# Patient Record
Sex: Female | Born: 1965 | Race: Black or African American | Hispanic: No | Marital: Single | Smoking: Never smoker
Health system: Southern US, Community
[De-identification: ages and names within clinical notes are randomized; demographics above are authoritative.]

## PROBLEM LIST (undated history)

## (undated) DIAGNOSIS — E7800 Pure hypercholesterolemia, unspecified: Secondary | ICD-10-CM

## (undated) DIAGNOSIS — I10 Essential (primary) hypertension: Secondary | ICD-10-CM

## (undated) DIAGNOSIS — G43909 Migraine, unspecified, not intractable, without status migrainosus: Secondary | ICD-10-CM

---

## 1999-11-07 HISTORY — PX: ABDOMINAL HYSTERECTOMY: SHX81

## 2001-06-21 ENCOUNTER — Emergency Department (HOSPITAL_COMMUNITY): Admission: EM | Admit: 2001-06-21 | Discharge: 2001-06-21 | Payer: Self-pay | Admitting: Emergency Medicine

## 2001-10-23 ENCOUNTER — Emergency Department (HOSPITAL_COMMUNITY): Admission: EM | Admit: 2001-10-23 | Discharge: 2001-10-23 | Payer: Self-pay | Admitting: Emergency Medicine

## 2001-12-10 ENCOUNTER — Emergency Department (HOSPITAL_COMMUNITY): Admission: EM | Admit: 2001-12-10 | Discharge: 2001-12-10 | Payer: Self-pay | Admitting: Emergency Medicine

## 2002-08-27 ENCOUNTER — Other Ambulatory Visit: Admission: RE | Admit: 2002-08-27 | Discharge: 2002-08-27 | Payer: Self-pay | Admitting: General Surgery

## 2004-03-10 ENCOUNTER — Ambulatory Visit (HOSPITAL_COMMUNITY): Admission: RE | Admit: 2004-03-10 | Discharge: 2004-03-10 | Payer: Self-pay | Admitting: *Deleted

## 2005-08-10 ENCOUNTER — Emergency Department (HOSPITAL_COMMUNITY): Admission: EM | Admit: 2005-08-10 | Discharge: 2005-08-10 | Payer: Self-pay | Admitting: Emergency Medicine

## 2006-02-05 ENCOUNTER — Ambulatory Visit (HOSPITAL_COMMUNITY): Admission: RE | Admit: 2006-02-05 | Discharge: 2006-02-05 | Payer: Self-pay | Admitting: Pulmonary Disease

## 2007-01-02 ENCOUNTER — Emergency Department (HOSPITAL_COMMUNITY): Admission: EM | Admit: 2007-01-02 | Discharge: 2007-01-02 | Payer: Self-pay | Admitting: Emergency Medicine

## 2007-01-29 ENCOUNTER — Emergency Department (HOSPITAL_COMMUNITY): Admission: EM | Admit: 2007-01-29 | Discharge: 2007-01-29 | Payer: Self-pay | Admitting: Emergency Medicine

## 2007-10-23 ENCOUNTER — Emergency Department (HOSPITAL_COMMUNITY): Admission: EM | Admit: 2007-10-23 | Discharge: 2007-10-23 | Payer: Self-pay | Admitting: Emergency Medicine

## 2008-04-06 ENCOUNTER — Emergency Department (HOSPITAL_COMMUNITY): Admission: EM | Admit: 2008-04-06 | Discharge: 2008-04-06 | Payer: Self-pay | Admitting: Emergency Medicine

## 2008-05-23 ENCOUNTER — Emergency Department (HOSPITAL_COMMUNITY): Admission: EM | Admit: 2008-05-23 | Discharge: 2008-05-23 | Payer: Self-pay | Admitting: Emergency Medicine

## 2009-01-09 ENCOUNTER — Emergency Department (HOSPITAL_COMMUNITY): Admission: EM | Admit: 2009-01-09 | Discharge: 2009-01-09 | Payer: Self-pay | Admitting: Emergency Medicine

## 2009-03-27 ENCOUNTER — Emergency Department (HOSPITAL_COMMUNITY): Admission: EM | Admit: 2009-03-27 | Discharge: 2009-03-27 | Payer: Self-pay | Admitting: Emergency Medicine

## 2009-12-27 ENCOUNTER — Emergency Department (HOSPITAL_COMMUNITY): Admission: EM | Admit: 2009-12-27 | Discharge: 2009-12-27 | Payer: Self-pay | Admitting: Emergency Medicine

## 2010-02-23 ENCOUNTER — Ambulatory Visit (HOSPITAL_COMMUNITY): Admission: RE | Admit: 2010-02-23 | Discharge: 2010-02-23 | Payer: Self-pay | Admitting: Internal Medicine

## 2010-04-09 ENCOUNTER — Emergency Department (HOSPITAL_COMMUNITY): Admission: EM | Admit: 2010-04-09 | Discharge: 2010-04-09 | Payer: Self-pay | Admitting: Emergency Medicine

## 2010-04-27 ENCOUNTER — Encounter (HOSPITAL_COMMUNITY): Admission: RE | Admit: 2010-04-27 | Discharge: 2010-05-27 | Payer: Self-pay | Admitting: Internal Medicine

## 2010-05-05 ENCOUNTER — Emergency Department (HOSPITAL_COMMUNITY): Admission: EM | Admit: 2010-05-05 | Discharge: 2010-05-05 | Payer: Self-pay | Admitting: Emergency Medicine

## 2010-05-30 ENCOUNTER — Encounter (HOSPITAL_COMMUNITY): Admission: RE | Admit: 2010-05-30 | Discharge: 2010-06-29 | Payer: Self-pay | Admitting: Internal Medicine

## 2010-05-31 ENCOUNTER — Ambulatory Visit (HOSPITAL_COMMUNITY): Admission: RE | Admit: 2010-05-31 | Discharge: 2010-05-31 | Payer: Self-pay | Admitting: Gastroenterology

## 2010-08-09 ENCOUNTER — Ambulatory Visit (HOSPITAL_COMMUNITY): Admission: RE | Admit: 2010-08-09 | Discharge: 2010-08-09 | Payer: Self-pay | Admitting: Gastroenterology

## 2010-11-27 ENCOUNTER — Encounter: Payer: Self-pay | Admitting: Internal Medicine

## 2010-11-28 ENCOUNTER — Encounter: Payer: Self-pay | Admitting: Internal Medicine

## 2011-01-25 LAB — URINALYSIS, ROUTINE W REFLEX MICROSCOPIC
Bilirubin Urine: NEGATIVE
Protein, ur: 30 mg/dL — AB
Urobilinogen, UA: 1 mg/dL (ref 0.0–1.0)

## 2011-02-14 LAB — RAPID STREP SCREEN (MED CTR MEBANE ONLY): Streptococcus, Group A Screen (Direct): NEGATIVE

## 2011-02-16 LAB — RAPID STREP SCREEN (MED CTR MEBANE ONLY): Streptococcus, Group A Screen (Direct): NEGATIVE

## 2011-02-16 LAB — STREP A DNA PROBE: Group A Strep Probe: POSITIVE

## 2011-08-03 LAB — DIFFERENTIAL
Basophils Absolute: 0
Basophils Relative: 1
Eosinophils Relative: 3
Lymphocytes Relative: 45

## 2011-08-03 LAB — BASIC METABOLIC PANEL
BUN: 11
Calcium: 9.5
GFR calc non Af Amer: >60
Glucose, Bld: 101 — ABNORMAL HIGH
Potassium: 3.6

## 2011-08-03 LAB — CBC
HCT: 37
Platelets: 381
RDW: 13

## 2011-08-04 LAB — RAPID STREP SCREEN (MED CTR MEBANE ONLY): Streptococcus, Group A Screen (Direct): POSITIVE — AB

## 2012-01-25 ENCOUNTER — Emergency Department (HOSPITAL_COMMUNITY)
Admission: EM | Admit: 2012-01-25 | Discharge: 2012-01-25 | Disposition: A | Discharge status: Home / Self Care | Payer: Self-pay | Attending: Emergency Medicine | Admitting: Emergency Medicine

## 2012-01-25 ENCOUNTER — Encounter (HOSPITAL_COMMUNITY): Payer: Self-pay | Admitting: *Deleted

## 2012-01-25 DIAGNOSIS — E7800 Pure hypercholesterolemia, unspecified: Secondary | ICD-10-CM | POA: Insufficient documentation

## 2012-01-25 DIAGNOSIS — R112 Nausea with vomiting, unspecified: Secondary | ICD-10-CM | POA: Insufficient documentation

## 2012-01-25 DIAGNOSIS — H53149 Visual discomfort, unspecified: Secondary | ICD-10-CM | POA: Insufficient documentation

## 2012-01-25 DIAGNOSIS — G43909 Migraine, unspecified, not intractable, without status migrainosus: Secondary | ICD-10-CM | POA: Insufficient documentation

## 2012-01-25 DIAGNOSIS — I10 Essential (primary) hypertension: Secondary | ICD-10-CM | POA: Insufficient documentation

## 2012-01-25 HISTORY — DX: Pure hypercholesterolemia, unspecified: E78.00

## 2012-01-25 HISTORY — DX: Migraine, unspecified, not intractable, without status migrainosus: G43.909

## 2012-01-25 HISTORY — DX: Essential (primary) hypertension: I10

## 2012-01-25 MED ORDER — METOCLOPRAMIDE HCL 5 MG/ML IJ SOLN
10.0000 mg | Freq: Once | INTRAMUSCULAR | Status: AC
  Administered 2012-01-25: 10 mg via INTRAMUSCULAR
  Filled 2012-01-25: qty 2

## 2012-01-25 MED ORDER — PROMETHAZINE HCL 25 MG PO TABS: 25.0000 mg | ORAL_TABLET | Freq: Four times a day (QID) | ORAL | Status: AC | PRN

## 2012-01-25 MED ORDER — KETOROLAC TROMETHAMINE 60 MG/2ML IM SOLN
60.0000 mg | Freq: Once | INTRAMUSCULAR | Status: AC
  Administered 2012-01-25: 60 mg via INTRAMUSCULAR
  Filled 2012-01-25: qty 2

## 2012-01-25 MED ORDER — DIPHENHYDRAMINE HCL 25 MG PO CAPS
25.0000 mg | ORAL_CAPSULE | Freq: Once | ORAL | Status: AC
  Administered 2012-01-25: 25 mg via ORAL
  Filled 2012-01-25: qty 1

## 2012-01-25 NOTE — Discharge Instructions (Signed)
Rest, follow-up with your primary care doctor or return to ED for any worsening symtpoms   Recurrent Migraine Headache  You have a recurrent migraine headache. The caregiver can usually provide good relief for this headache. If this headache is the same as your previous migraine headaches, it is safe to treat you without repeating a complete evaluation.  These headaches usually have at least two of the following problems:   They occur on one side of the head, pulsate, and are severe enough to prevent daily activities.   They are aggravated by daily physical activities.  You may have one or more of the following symptoms:   Nausea (feeling sick to your stomach).   Vomiting.   Pain with exposure to bright lights or loud noises.  Most headache sufferers have a family history of migraines. Your headaches may also be related to alcohol and smoking habits. Too much sleep, too little sleep, mood, and anxiety may also play a part. Changing some of these triggers may help you lower the number and level of pain of the headaches. Headaches may be related to menses (female menstruation). There are numerous medications that can prevent these headaches. Your caregiver can help you with a medication or regimen (procedure to follow). If this has been a chronic (long-term) condition, the use of long-term narcotics is not recommended. Using long-term narcotics can cause recurrent migraines. Narcotics are only a temporary measure only. They are used for the infrequent migraine that fails to respond to all other measures. SEEK MEDICAL CARE IF:   You do not get relief from the medications given to you.   You have a recurrence of pain.   This headache begins to differ from past migraine (for example if it is more severe).  SEEK IMMEDIATE MEDICAL CARE IF:  You have a fever.   You have a stiff neck.   You have vision loss or have changes in vision.   You have problems with feeling lightheaded, become  faint, or lose your balance.   You have muscular weakness.   You have loss of muscular control.   You develop severe symptoms different from your first symptoms.   You start losing your balance or have trouble walking.   You feel faint or pass out.  MAKE SURE YOU:   Understand these instructions.   Will watch your condition.   Will get help right away if you are not doing well or get worse.  Document Released: 07/18/2001 Document Revised: 10/12/2011 Document Reviewed: 06/11/2008 [facility] Patient Information 2012 [place], [place].

## 2012-01-25 NOTE — ED Provider Notes (Signed)
History     CSN: [id number]  Arrival date & time 01/25/12  4098   First MD Initiated Contact with Patient 01/25/12 [phone]      Chief Complaint  Patient presents with  . Migraine    (Consider location/radiation/quality/duration/timing/severity/associated sxs/prior treatment) HPI Comments: Patient complains of a headache that began this morning. She describes the headache as a throbbing sensation to the parietal area. Headache has been gradual in onset She states the pain is similar to her previous migraines. She also reports photophobia , nausea and vomiting this morning. She denies neck pain or stiffness, fever, chest pain, numbness or weakness.  Patient is a 46 y.o. female presenting with headaches. The history is provided by the patient. No language interpreter was used.  Headache  This is a new problem. The current episode started 1 to 2 hours ago. The problem occurs constantly. The problem has not changed since onset.The headache is associated with bright light. The pain is located in the parietal region. The quality of the pain is described as throbbing. The pain is moderate. The pain does not radiate. Associated symptoms include nausea and vomiting. Pertinent negatives include no fever, no chest pressure, no near-syncope, no orthopnea and no shortness of breath. She has tried nothing for the symptoms. The treatment provided no relief.    Past Medical History  Diagnosis Date  . Migraines   . High cholesterol   . Hypertension     History reviewed. No pertinent past surgical history.  History reviewed. No pertinent family history.  History  Substance Use Topics  . Smoking status: Never Smoker   . Smokeless tobacco: Not on file  . Alcohol Use: No    OB History    Grav Para Term Preterm Abortions TAB SAB Ect Mult Living                  Review of Systems  Constitutional: Negative for fever and chills.  HENT: Negative for sore throat, trouble swallowing, neck pain and  neck stiffness.   Respiratory: Negative for shortness of breath.   Cardiovascular: Negative for chest pain, orthopnea and near-syncope.  Gastrointestinal: Positive for nausea and vomiting. Negative for abdominal pain and blood in stool.  Musculoskeletal: Negative for myalgias, back pain and arthralgias.  Skin: Negative for rash.  Neurological: Positive for headaches. Negative for dizziness, facial asymmetry, speech difficulty, weakness and numbness.  Hematological: Does not bruise/bleed easily.  All other systems reviewed and are negative.    Allergies  Review of patient's allergies indicates no known allergies.  Home Medications  No current outpatient prescriptions on file.  BP 120/75  Pulse 92  Temp(Src) 98.4 F (36.9 C) (Oral)  Resp 16  Ht 5\' 1"  (1.549 m)  Wt 165 lb (74.844 kg)  BMI 31.18 kg/m2  SpO2 98%  Physical Exam  Nursing note and vitals reviewed. Constitutional: She is oriented to person, place, and time. She appears well-developed and well-nourished. No distress.  HENT:  Head: Normocephalic and atraumatic.  Mouth/Throat: Oropharynx is clear and moist.  Eyes: EOM are normal. Pupils are equal, round, and reactive to light.  Neck: Normal range of motion and phonation normal. Neck supple. No Brudzinski's sign and no Kernig's sign noted.  Cardiovascular: Normal rate, regular rhythm, normal heart sounds and intact distal pulses.   No murmur heard. Pulmonary/Chest: Effort normal and breath sounds normal. No respiratory distress.  Abdominal: Soft. She exhibits no distension. There is no tenderness. There is no rebound.  Musculoskeletal: Normal range of  motion. She exhibits no tenderness.  Lymphadenopathy:    She has no cervical adenopathy.  Neurological: She is alert and oriented to person, place, and time. No cranial nerve deficit or sensory deficit. She exhibits normal muscle tone. Coordination and gait normal. GCS eye subscore is 4. GCS verbal subscore is 5. GCS motor  subscore is 6.  Reflex Scores:      Tricep reflexes are 2+ on the right side and 2+ on the left side.      Bicep reflexes are 2+ on the right side and 2+ on the left side.      Brachioradialis reflexes are 2+ on the right side and 2+ on the left side.      Patellar reflexes are 2+ on the right side and 2+ on the left side.      Achilles reflexes are 2+ on the right side and 2+ on the left side. Skin: Skin is warm and dry.    ED Course  Procedures (including critical care time)      MDM   Patient with history of recurrent migraine headaches and emergency department with a similar headache that began this morning. Vital signs are stable, she is nontoxic appearing complaining of nausea and vomiting but none during ED stay. No meningeal signs on exam.  Patient was treated with IM Toradol and Reglan and by mouth Benadryl.  Patient is now feeling better states her headache and nausea have greatly improved and requesting to go home. I have reviewed patient's previous ED charts and nursing notes, patient has been here several times for her migraine headaches  Patient / Family / Caregiver understand and agree with initial ED impression and plan with expectations set for ED visit. Pt stable in ED with no significant deterioration in condition. Pt feels improved after observation and/or treatment in ED.         Lajuane Leatham L. H. [place], [place] 01/25/12 1714

## 2012-01-25 NOTE — ED Notes (Signed)
Pt states that she woke up with a migraine this am. Also c/o nausea, vomiting and photophobia.

## 2012-01-26 NOTE — ED Provider Notes (Signed)
Medical screening examination/treatment/procedure(s) were performed by non-physician practitioner and as supervising physician I was immediately available for consultation/collaboration.  Nicoletta Dress. Colon Branch, MD 01/26/12 [phone]

## 2012-05-05 ENCOUNTER — Emergency Department (HOSPITAL_COMMUNITY)
Admission: EM | Admit: 2012-05-05 | Discharge: 2012-05-05 | Disposition: A | Discharge status: Home / Self Care | Payer: Self-pay | Attending: Emergency Medicine | Admitting: Emergency Medicine

## 2012-05-05 ENCOUNTER — Encounter (HOSPITAL_COMMUNITY): Payer: Self-pay | Admitting: Emergency Medicine

## 2012-05-05 DIAGNOSIS — M65839 Other synovitis and tenosynovitis, unspecified forearm: Secondary | ICD-10-CM | POA: Insufficient documentation

## 2012-05-05 DIAGNOSIS — M778 Other enthesopathies, not elsewhere classified: Secondary | ICD-10-CM

## 2012-05-05 MED ORDER — OXYCODONE-ACETAMINOPHEN 5-325 MG PO TABS: 1.0000 | ORAL_TABLET | Freq: Four times a day (QID) | ORAL | Status: AC | PRN

## 2012-05-05 MED ORDER — KETOROLAC TROMETHAMINE 60 MG/2ML IM SOLN
60.0000 mg | Freq: Once | INTRAMUSCULAR | Status: AC
  Administered 2012-05-05: 60 mg via INTRAMUSCULAR
  Filled 2012-05-05: qty 2

## 2012-05-05 MED ORDER — MELOXICAM 7.5 MG PO TABS: ORAL_TABLET | ORAL | Status: DC

## 2012-05-05 NOTE — ED Provider Notes (Signed)
Medical screening examination/treatment/procedure(s) were performed by non-physician practitioner and as supervising physician I was immediately available for consultation/collaboration.  Devoria Albe, MD, Armando Gang   Ward Givens, MD 05/05/12 [phone]

## 2012-05-05 NOTE — ED Notes (Signed)
Patient with c/o right wrist pain x 1 week. Denies known injury. CMS intact. Swelling noted.

## 2012-05-05 NOTE — ED Provider Notes (Signed)
History     CSN: [id number]  Arrival date & time 05/05/12  1106   First MD Initiated Contact with Patient 05/05/12 1317      Chief Complaint  Patient presents with  . Wrist Pain    (Consider location/radiation/quality/duration/timing/severity/associated sxs/prior treatment) Patient is a 46 y.o. female presenting with wrist pain. The history is provided by the patient.  Wrist Pain This is a new problem. The current episode started in the past 7 days. The problem occurs constantly. The problem has been gradually worsening. Associated symptoms include arthralgias. The symptoms are aggravated by bending. She has tried NSAIDs for the symptoms. The treatment provided no relief.    Past Medical History  Diagnosis Date  . Migraines   . High cholesterol   . Hypertension     History reviewed. No pertinent past surgical history.  No family history on file.  History  Substance Use Topics  . Smoking status: Never Smoker   . Smokeless tobacco: Not on file  . Alcohol Use: No    OB History    Grav Para Term Preterm Abortions TAB SAB Ect Mult Living                  Review of Systems  Musculoskeletal: Positive for arthralgias.    Allergies  Review of patient's allergies indicates no known allergies.  Home Medications   Current Outpatient Rx  Name Route Sig Dispense Refill  . ATORVASTATIN CALCIUM 40 MG PO TABS Oral Take 20 mg by mouth at bedtime.    . IBUPROFEN 800 MG PO TABS Oral Take 800 mg by mouth every 8 (eight) hours as needed. For pain    . QUINAPRIL HCL 20 MG PO TABS Oral Take 10 mg by mouth daily.     . TOPIRAMATE 25 MG PO TABS Oral Take 25 mg by mouth at bedtime.    . MELOXICAM 7.5 MG PO TABS  1 po bid with food 12 tablet 0  . OXYCODONE-ACETAMINOPHEN 5-325 MG PO TABS Oral Take 1 tablet by mouth every 6 (six) hours as needed for pain. 20 tablet 0    BP 127/80  Pulse 80  Temp 98.3 F (36.8 C) (Oral)  Resp 16  Ht 5\' 1"  (1.549 m)  Wt 159 lb (72.122 kg)  BMI  30.04 kg/m2  SpO2 100%  Physical Exam  Nursing note and vitals reviewed. Constitutional: She is oriented to person, place, and time. She appears well-developed and well-nourished.  Non-toxic appearance.  HENT:  Head: Normocephalic.  Right Ear: Tympanic membrane and external ear normal.  Left Ear: Tympanic membrane and external ear normal.  Eyes: EOM and lids are normal. Pupils are equal, round, and reactive to light.  Neck: Normal range of motion. Neck supple. Carotid bruit is not present.  Cardiovascular: Normal rate, regular rhythm, normal heart sounds, intact distal pulses and normal pulses.   Pulmonary/Chest: Breath sounds normal. No respiratory distress.  Abdominal: Soft. Bowel sounds are normal. There is no tenderness. There is no guarding.  Musculoskeletal: Normal range of motion.       There is pain to palpation and with attempted range of motion of the right thumb. This causes pain extending into the right wrist and forearm. There is no deformity present. Is good capillary refill. Sensory noted.  Lymphadenopathy:       Head (right side): No submandibular adenopathy present.       Head (left side): No submandibular adenopathy present.    She has no cervical adenopathy.  Neurological: She is alert and oriented to person, place, and time. She has normal strength. No cranial nerve deficit or sensory deficit.  Skin: Skin is warm and dry.  Psychiatric: She has a normal mood and affect. Her speech is normal.    ED Course  Procedures (including critical care time)  Labs Reviewed - No data to display No results found.   1. Tendonitis of wrist, right       MDM  I have reviewed nursing notes, vital signs, and all appropriate lab and imaging results for this patient. Examination is consistent with a tendinitis involving the right thumb and wrist. The patient is fitted with a Velcro thumb spica. Patient is placed on Mobic 7.5 mg twice daily with food. She's given a prescription  for Percocet one every 6 hours as needed for pain. Patient is to see orthopedics if not improving.       Kathie Dike, [place] 05/05/12 1355

## 2012-05-05 NOTE — ED Notes (Signed)
Pt c/o right thumb, hand, wrist pain for a week, pt states that the pain starts in the right thumb area and extends down to the right wrist area, denies any injury, area painful, cms intact distal

## 2012-05-05 NOTE — Discharge Instructions (Signed)
Please use the thumb spica splint for the next 10 days. Mobic 7.5 mg twice daily with food. Percocet one every 6 hours as needed for pain. This medication may cause drowsiness, please use with caution. Please see the orthopedist listed above if not improving.Tendinitis Tendinitis is swelling and inflammation of the tendons. Tendons are band-like tissues that connect muscle to bone. Tendinitis commonly occurs in the:   Shoulders (rotator cuff).   Heels (Achilles tendon).   Elbows (triceps tendon).  CAUSES Tendinitis is usually caused by overusing the tendon, muscles, and joints involved. When the tissue surrounding a tendon (synovium) becomes inflamed, it is called tenosynovitis. Tendinitis commonly develops in people whose jobs require repetitive motions. SYMPTOMS  Pain.   Tenderness.   Mild swelling.  DIAGNOSIS Tendinitis is usually diagnosed by physical exam. Your caregiver may also order X-rays or other imaging tests. TREATMENT Your caregiver may recommend certain medicines or exercises for your treatment. HOME CARE INSTRUCTIONS   Use a sling or splint for as long as directed by your caregiver until the pain decreases.   Put ice on the injured area.   Put ice in a plastic bag.   Place a towel between your skin and the bag.   Leave the ice on for 15 to 20 minutes, 3 to 4 times a day.   Avoid using the limb while the tendon is painful. Perform gentle range of motion exercises only as directed by your caregiver. Stop exercises if pain or discomfort increase, unless directed otherwise by your caregiver.   Only take over-the-counter or prescription medicines for pain, discomfort, or fever as directed by your caregiver.  SEEK MEDICAL CARE IF:   Your pain and swelling increase.   You develop new, unexplained symptoms, especially increased numbness in the hands.  MAKE SURE YOU:   Understand these instructions.   Will watch your condition.   Will get help right away if you  are not doing well or get worse.  Document Released: 10/20/2000 Document Revised: 10/12/2011 Document Reviewed: 01/09/2011 [facility] Patient Information 2012 [place], [place].

## 2012-06-26 ENCOUNTER — Ambulatory Visit (HOSPITAL_COMMUNITY)
Admission: RE | Admit: 2012-06-26 | Discharge: 2012-06-26 | Disposition: A | Discharge status: Home / Self Care | Payer: Self-pay | Source: Ambulatory Visit | Attending: Nurse Practitioner | Admitting: Nurse Practitioner

## 2012-06-26 ENCOUNTER — Other Ambulatory Visit (HOSPITAL_COMMUNITY): Payer: Self-pay | Admitting: Nurse Practitioner

## 2012-06-26 DIAGNOSIS — M25539 Pain in unspecified wrist: Secondary | ICD-10-CM | POA: Insufficient documentation

## 2012-06-26 DIAGNOSIS — M79609 Pain in unspecified limb: Secondary | ICD-10-CM | POA: Insufficient documentation

## 2012-06-26 DIAGNOSIS — R609 Edema, unspecified: Secondary | ICD-10-CM

## 2012-06-26 DIAGNOSIS — R52 Pain, unspecified: Secondary | ICD-10-CM

## 2012-06-26 DIAGNOSIS — M25569 Pain in unspecified knee: Secondary | ICD-10-CM | POA: Insufficient documentation

## 2012-07-24 ENCOUNTER — Emergency Department (HOSPITAL_COMMUNITY): Payer: Self-pay

## 2012-07-24 ENCOUNTER — Encounter (HOSPITAL_COMMUNITY): Payer: Self-pay | Admitting: Emergency Medicine

## 2012-07-24 ENCOUNTER — Emergency Department (HOSPITAL_COMMUNITY)
Admission: EM | Admit: 2012-07-24 | Discharge: 2012-07-24 | Disposition: A | Discharge status: Home / Self Care | Payer: Self-pay | Attending: Emergency Medicine | Admitting: Emergency Medicine

## 2012-07-24 DIAGNOSIS — I10 Essential (primary) hypertension: Secondary | ICD-10-CM | POA: Insufficient documentation

## 2012-07-24 DIAGNOSIS — G43909 Migraine, unspecified, not intractable, without status migrainosus: Secondary | ICD-10-CM | POA: Insufficient documentation

## 2012-07-24 DIAGNOSIS — E7800 Pure hypercholesterolemia, unspecified: Secondary | ICD-10-CM | POA: Insufficient documentation

## 2012-07-24 DIAGNOSIS — Z79899 Other long term (current) drug therapy: Secondary | ICD-10-CM | POA: Insufficient documentation

## 2012-07-24 DIAGNOSIS — S86912A Strain of unspecified muscle(s) and tendon(s) at lower leg level, left leg, initial encounter: Secondary | ICD-10-CM

## 2012-07-24 DIAGNOSIS — M25569 Pain in unspecified knee: Secondary | ICD-10-CM | POA: Insufficient documentation

## 2012-07-24 MED ORDER — ONDANSETRON HCL 4 MG/2ML IJ SOLN: 4.0000 mg | Freq: Once | INTRAMUSCULAR | Status: DC

## 2012-07-24 MED ORDER — ONDANSETRON 4 MG PO TBDP
4.0000 mg | ORAL_TABLET | Freq: Once | ORAL | Status: AC
  Administered 2012-07-24: 4 mg via ORAL

## 2012-07-24 MED ORDER — MELOXICAM 7.5 MG PO TABS: ORAL_TABLET | ORAL | Status: DC

## 2012-07-24 MED ORDER — HYDROCODONE-ACETAMINOPHEN 7.5-325 MG PO TABS: 1.0000 | ORAL_TABLET | ORAL | Status: DC | PRN

## 2012-07-24 MED ORDER — HYDROCODONE-ACETAMINOPHEN 5-325 MG PO TABS
2.0000 | ORAL_TABLET | Freq: Once | ORAL | Status: AC
  Administered 2012-07-24: 2 via ORAL
  Filled 2012-07-24: qty 2

## 2012-07-24 MED ORDER — KETOROLAC TROMETHAMINE 10 MG PO TABS
10.0000 mg | ORAL_TABLET | Freq: Once | ORAL | Status: AC
  Administered 2012-07-24: 10 mg via ORAL
  Filled 2012-07-24: qty 1

## 2012-07-24 MED ORDER — ONDANSETRON 4 MG PO TBDP
ORAL_TABLET | ORAL | Status: AC
  Administered 2012-07-24: 4 mg via ORAL
  Filled 2012-07-24: qty 1

## 2012-07-24 NOTE — ED Notes (Signed)
Patient with c/o left knee pain. H/o same. No injury. Reports twisting motion yesterday during work.

## 2012-07-24 NOTE — ED Provider Notes (Signed)
Medical screening examination/treatment/procedure(s) were performed by non-physician practitioner and as supervising physician I was immediately available for consultation/collaboration.   Benny Lennert, MD 07/24/12 1539

## 2012-07-24 NOTE — ED Provider Notes (Signed)
History     CSN: [id number]  Arrival date & time 07/24/12  0932   First MD Initiated Contact with Patient 07/24/12 1106      Chief Complaint  Patient presents with  . Knee Pain    (Consider location/radiation/quality/duration/timing/severity/associated sxs/prior treatment) Patient is a 46 y.o. female presenting with knee pain. The history is provided by the patient.  Knee Pain This is a new problem. The current episode started yesterday. The problem occurs constantly. The problem has been gradually worsening. Associated symptoms include arthralgias and headaches. Pertinent negatives include no abdominal pain, chest pain, coughing or neck pain. The symptoms are aggravated by standing and walking. She has tried NSAIDs for the symptoms. The treatment provided no relief.    Past Medical History  Diagnosis Date  . Migraines   . High cholesterol   . Hypertension     History reviewed. No pertinent past surgical history.  No family history on file.  History  Substance Use Topics  . Smoking status: Never Smoker   . Smokeless tobacco: Not on file  . Alcohol Use: No    OB History    Grav Para Term Preterm Abortions TAB SAB Ect Mult Living                  Review of Systems  Constitutional: Negative for activity change.       All ROS Neg except as noted in HPI  HENT: Negative for nosebleeds and neck pain.   Eyes: Negative for photophobia and discharge.  Respiratory: Negative for cough, shortness of breath and wheezing.   Cardiovascular: Negative for chest pain and palpitations.  Gastrointestinal: Negative for abdominal pain and blood in stool.  Genitourinary: Negative for dysuria, frequency and hematuria.  Musculoskeletal: Positive for arthralgias. Negative for back pain.  Skin: Negative.   Neurological: Positive for headaches. Negative for dizziness, seizures and speech difficulty.  Psychiatric/Behavioral: Negative for hallucinations and confusion.    Allergies    Review of patient's allergies indicates no known allergies.  Home Medications   Current Outpatient Rx  Name Route Sig Dispense Refill  . ATORVASTATIN CALCIUM 40 MG PO TABS Oral Take 20 mg by mouth at bedtime.    . IBUPROFEN 800 MG PO TABS Oral Take 800 mg by mouth every 8 (eight) hours as needed. For pain    . MELOXICAM 7.5 MG PO TABS  1 po bid with food 12 tablet 0  . QUINAPRIL HCL 20 MG PO TABS Oral Take 10 mg by mouth daily.     . TOPIRAMATE 25 MG PO TABS Oral Take 25 mg by mouth at bedtime.      BP 103/70  Pulse 79  Temp 98.2 F (36.8 C) (Oral)  Resp 17  Ht 5\' 2"  (1.575 m)  Wt 153 lb (69.4 kg)  BMI 27.98 kg/m2  SpO2 100%  Physical Exam  Nursing note and vitals reviewed. Constitutional: She is oriented to person, place, and time. She appears well-developed and well-nourished.  Non-toxic appearance.  HENT:  Head: Normocephalic.  Right Ear: Tympanic membrane and external ear normal.  Left Ear: Tympanic membrane and external ear normal.  Eyes: EOM and lids are normal. Pupils are equal, round, and reactive to light.  Neck: Normal range of motion. Neck supple. Carotid bruit is not present.  Cardiovascular: Normal rate, regular rhythm, normal heart sounds, intact distal pulses and normal pulses.   Pulmonary/Chest: Breath sounds normal. No respiratory distress.  Abdominal: Soft. Bowel sounds are normal. There is no  tenderness. There is no guarding.  Musculoskeletal:       There is full range of motion of the left hip. There is pain with range of motion of the left knee. The knee is not hot. There are degenerative changes of the patella. Is no posterior mass appreciated. The Achilles tendon is intact. Sensory is intact.  Lymphadenopathy:       Head (right side): No submandibular adenopathy present.       Head (left side): No submandibular adenopathy present.    She has no cervical adenopathy.  Neurological: She is alert and oriented to person, place, and time. She has normal  strength. No cranial nerve deficit or sensory deficit.  Skin: Skin is warm and dry.  Psychiatric: She has a normal mood and affect. Her speech is normal.    ED Course  Procedures (including critical care time)  Labs Reviewed - No data to display Dg Knee Complete 4 Views Left  07/24/2012  *RADIOLOGY REPORT*  Clinical Data: Twisting injury.  Lateral knee pain.  LEFT KNEE - COMPLETE 4+ VIEW  Comparison: 06/26/2012.  Findings: The joint spaces are maintained.  No acute fracture or osteochondral abnormality.  A small joint effusion is suspected.  IMPRESSION: No acute bony findings.   Original Report Authenticated By: P. Loralie Champagne, M.D.      No diagnosis found.    MDM  I have reviewed nursing notes, vital signs, and all appropriate lab and imaging results for this patient. The x-ray of the left knee shows a very small joint effusion but no other bony abnormalities. The patient is fitted with a knee immobilizer and crutches. Prescription given for Norco 7.5 mg one every 4 hours #20. Diclofenac one tablet 2 times daily with food. Patient is to see the orthopedic physician if not improving.       Kathie Dike, [place] 07/24/12 1157

## 2012-07-28 ENCOUNTER — Emergency Department (HOSPITAL_COMMUNITY)
Admission: EM | Admit: 2012-07-28 | Discharge: 2012-07-28 | Disposition: A | Discharge status: Home / Self Care | Payer: Self-pay | Attending: Emergency Medicine | Admitting: Emergency Medicine

## 2012-07-28 ENCOUNTER — Encounter (HOSPITAL_COMMUNITY): Payer: Self-pay

## 2012-07-28 DIAGNOSIS — I10 Essential (primary) hypertension: Secondary | ICD-10-CM | POA: Insufficient documentation

## 2012-07-28 DIAGNOSIS — S86912A Strain of unspecified muscle(s) and tendon(s) at lower leg level, left leg, initial encounter: Secondary | ICD-10-CM

## 2012-07-28 DIAGNOSIS — E7800 Pure hypercholesterolemia, unspecified: Secondary | ICD-10-CM | POA: Insufficient documentation

## 2012-07-28 DIAGNOSIS — M25569 Pain in unspecified knee: Secondary | ICD-10-CM | POA: Insufficient documentation

## 2012-07-28 DIAGNOSIS — M79609 Pain in unspecified limb: Secondary | ICD-10-CM | POA: Insufficient documentation

## 2012-07-28 MED ORDER — OXYCODONE-ACETAMINOPHEN 5-325 MG PO TABS: 1.0000 | ORAL_TABLET | ORAL | Status: AC | PRN

## 2012-07-28 MED ORDER — CYCLOBENZAPRINE HCL 5 MG PO TABS: 5.0000 mg | ORAL_TABLET | Freq: Three times a day (TID) | ORAL | Status: DC | PRN

## 2012-07-28 NOTE — ED Notes (Signed)
Pt reports left knee/leg pain. Was seen in er for the same on Wednesday. Unable to sleep last night.

## 2012-07-28 NOTE — ED Notes (Signed)
Pt c/o left leg pain, was seen in er a few days ago for same, Raynelle Fanning, [place] in prior to RN, see PA assessment for further.

## 2012-07-28 NOTE — ED Provider Notes (Signed)
Medical screening examination/treatment/procedure(s) were performed by non-physician practitioner and as supervising physician I was immediately available for consultation/collaboration.   Decklyn Hornik, MD 07/28/12 1533 

## 2012-07-28 NOTE — ED Provider Notes (Signed)
History     CSN: [id number]  Arrival date & time 07/28/12  4098   First MD Initiated Contact with Patient 07/28/12 1015      Chief Complaint  Patient presents with  . Leg Pain    (Consider location/radiation/quality/duration/timing/severity/associated sxs/prior treatment) HPI Comments: JOEANN HAAR presents for further pain management of her left knee.  She was seen here 4 days ago for an inversion injury to her left knee which occurred at work.  An xray revealed a small effusion of this joint.  She has been using her knee immobilizer (but not wearing now) and crutches and hydrocodone and naproxen which has helped until last night - she was awake all night with throbbing pain,  But denies new injury.  She has not established orthopedic care yet, but is concerned about returning to work tomorrow - she would like her work note  Extended.  The history is provided by the patient.    Past Medical History  Diagnosis Date  . Migraines   . High cholesterol   . Hypertension     History reviewed. No pertinent past surgical history.  No family history on file.  History  Substance Use Topics  . Smoking status: Never Smoker   . Smokeless tobacco: Not on file  . Alcohol Use: No    OB History    Grav Para Term Preterm Abortions TAB SAB Ect Mult Living                  Review of Systems  Musculoskeletal: Positive for joint swelling and arthralgias.  Skin: Negative for wound.  Neurological: Negative for weakness and numbness.    Allergies  Review of patient's allergies indicates no known allergies.  Home Medications   Current Outpatient Rx  Name Route Sig Dispense Refill  . ATORVASTATIN CALCIUM 40 MG PO TABS Oral Take 20 mg by mouth at bedtime.    Marland Kitchen LISINOPRIL 10 MG PO TABS Oral Take 10 mg by mouth daily.    . MELOXICAM 7.5 MG PO TABS Oral Take 7.5 mg by mouth 2 (two) times daily.    . TOPIRAMATE 25 MG PO TABS Oral Take 25 mg by mouth at bedtime.    . TRAMADOL HCL  50 MG PO TABS Oral Take 50 mg by mouth every 6 (six) hours as needed. Pain    . CYCLOBENZAPRINE HCL 5 MG PO TABS Oral Take 1 tablet (5 mg total) by mouth 3 (three) times daily as needed for muscle spasms. 15 tablet 0  . OXYCODONE-ACETAMINOPHEN 5-325 MG PO TABS Oral Take 1 tablet by mouth every 4 (four) hours as needed for pain. 20 tablet 0    BP 119/76  Pulse 72  Temp 98.3 F (36.8 C) (Oral)  Resp 20  SpO2 100%  Physical Exam  Constitutional: She appears well-developed and well-nourished.  HENT:  Head: Atraumatic.  Neck: Normal range of motion.  Cardiovascular:       Pulses equal bilaterally  Musculoskeletal: She exhibits tenderness.       Left knee: She exhibits swelling. tenderness found. Medial joint line and lateral joint line tenderness noted.       Modest swelling noted along inferior patella.  No effusion appreciated,  Mild crepitus with ROM.  Neurological: She is alert. She has normal strength. She displays normal reflexes. No sensory deficit.       Equal strength  Skin: Skin is warm and dry.  Psychiatric: She has a normal mood and affect.  ED Course  Procedures (including critical care time)  Labs Reviewed - No data to display No results found.   1. Strain of left knee and leg       MDM  Patient was encouraged to continue with knee immobilizer and crutches.  She was switched to oxycodone and also prescribed Flexeril as she has complaints of pain radiating into her anterior mid tibia area and states she had a muscle cramp last night in this area.  This was not appreciated on today's exam.  Also encouraged elevation.  Patient to followup with PCP and/or orthopedics as originally referred.        Burgess Amor, [place] 07/28/12 1138

## 2012-12-05 ENCOUNTER — Ambulatory Visit (HOSPITAL_COMMUNITY)
Admission: RE | Admit: 2012-12-05 | Discharge: 2012-12-05 | Disposition: A | Discharge status: Home / Self Care | Payer: BC Managed Care – PPO | Source: Ambulatory Visit | Attending: Internal Medicine | Admitting: Internal Medicine

## 2012-12-05 ENCOUNTER — Other Ambulatory Visit (HOSPITAL_COMMUNITY): Payer: Self-pay | Admitting: Internal Medicine

## 2012-12-05 DIAGNOSIS — M25539 Pain in unspecified wrist: Secondary | ICD-10-CM

## 2013-02-04 ENCOUNTER — Ambulatory Visit (INDEPENDENT_AMBULATORY_CARE_PROVIDER_SITE_OTHER): Payer: BC Managed Care – PPO | Admitting: Orthopedic Surgery

## 2013-02-04 ENCOUNTER — Encounter: Payer: Self-pay | Admitting: Orthopedic Surgery

## 2013-02-04 VITALS — BP 102/62 | Ht 62.0 in | Wt 173.0 lb

## 2013-02-04 DIAGNOSIS — M654 Radial styloid tenosynovitis [de Quervain]: Secondary | ICD-10-CM

## 2013-02-04 NOTE — Patient Instructions (Signed)
De Quervain's Disease Suzette Battiest disease is a condition often seen in racquet sports where there is a soreness (inflammation) in the cord like structures (tendons) which attach muscle to bone on the thumb side of the wrist. There may be a tightening of the tissuesaround the tendons. This condition is often helped by giving up or modifying the activity which caused it. When conservative treatment does not help, surgery may be required. Conservative treatment could include changes in the activity which brought about the problem or made it worse. Anti-inflammatory medications and injections may be used to help decrease the inflammation and help with pain control. Your caregiver will help you determine which is best for you. DIAGNOSIS  Often the diagnosis (learning what is wrong) can be made by examination. Sometimes x-rays are required. HOME CARE INSTRUCTIONS   Apply ice to the sore area for 15 to 20 minutes, 3 to 4 times per day while awake. Put the ice in a plastic bag and place a towel between the bag of ice and your skin. This is especially helpful if it can be done after all activities involving the sore wrist.  Temporary splinting may help.  Only take over-the-counter or prescription medicines for pain, discomfort or fever as directed by your caregiver. SEEK MEDICAL CARE IF:   Pain relief is not obtained with medications, or if you have increasing pain and seem to be getting worse rather than better. MAKE SURE YOU:   Understand these instructions.  Will watch your condition.  Will get help right away if you are not doing well or get worse. Document Released: 07/18/2001 Document Revised: 01/15/2012 Document Reviewed: 10/23/2005 [facility] Patient Information 2013 [place], [place].   Wear a splint for 6 weeks   Apply capzacin cream 3 times a day   Apply ice for 20 min 3 x a d ay

## 2013-02-04 NOTE — Progress Notes (Signed)
Patient ID: Gloria Huerta, female   DOB: 1966/03/05, 47 y.o.   MRN: [id number] Chief Complaint  Patient presents with  . 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 She tried warm compresses which seemed to help a little bit she has severe pain with ulnar deviation not responsive to Relafen 500 mg twice a day pain level VIII/X seems to come and go worse with activity morning and at night  She denies all of the review of systems symptoms  She lists no medical problems or previous surgeries  Followed by, medical  She is single does not smoke or drink alcohol denies any previous surgery  General appearance is normal, the patient is alert and oriented x3 with normal mood and affect. BP 102/62  Ht 5\' 2"  (1.575 m)  Wt 173 lb (78.472 kg)  BMI 31.63 kg/m2  Right wrist evaluation shows swelling and tenderness over the first extensor compartment with painful ulnar deviation range of motion is otherwise full wrist is stable pinch is normal scans intact good radial pulse normal capillary refill normal sensation  X-rays were done and they show no arthritis  These were done at [facility] facility  Impression de Quervain's syndrome recommend bracing

## 2013-03-18 ENCOUNTER — Encounter: Payer: Self-pay | Admitting: Orthopedic Surgery

## 2013-03-18 ENCOUNTER — Ambulatory Visit: Payer: BC Managed Care – PPO | Admitting: Orthopedic Surgery

## 2013-04-08 ENCOUNTER — Encounter: Payer: Self-pay | Admitting: Orthopedic Surgery

## 2013-04-08 ENCOUNTER — Ambulatory Visit (INDEPENDENT_AMBULATORY_CARE_PROVIDER_SITE_OTHER): Payer: BC Managed Care – PPO | Admitting: Orthopedic Surgery

## 2013-04-08 VITALS — BP 102/56 | Ht 62.0 in | Wt 173.0 lb

## 2013-04-08 DIAGNOSIS — M654 Radial styloid tenosynovitis [de Quervain]: Secondary | ICD-10-CM

## 2013-04-08 NOTE — Progress Notes (Signed)
Patient ID: Gloria Huerta, female   DOB: [birth year], 47 y.o.   MRN: [id number] Chief Complaint  Patient presents with  . Follow-up    6 week follow up De Quervain's syndrome s/p brace    BP 102/56  Ht 5\' 2"  (1.575 m)  Wt 173 lb (78.472 kg)  BMI 31.63 kg/m2  Right wrist pain improved with brace  No swelling Painless Finkelstein's   Discharge

## 2013-10-20 IMAGING — CR DG KNEE COMPLETE 4+V*L*
4 series · 4 of 4 positions shown · non-contrast
Comparison: 02/05/2006

CLINICAL DATA: Chronic knee pain, no trauma

LEFT KNEE - COMPLETE 4+ VIEW

[view not recorded (1 of 4)]
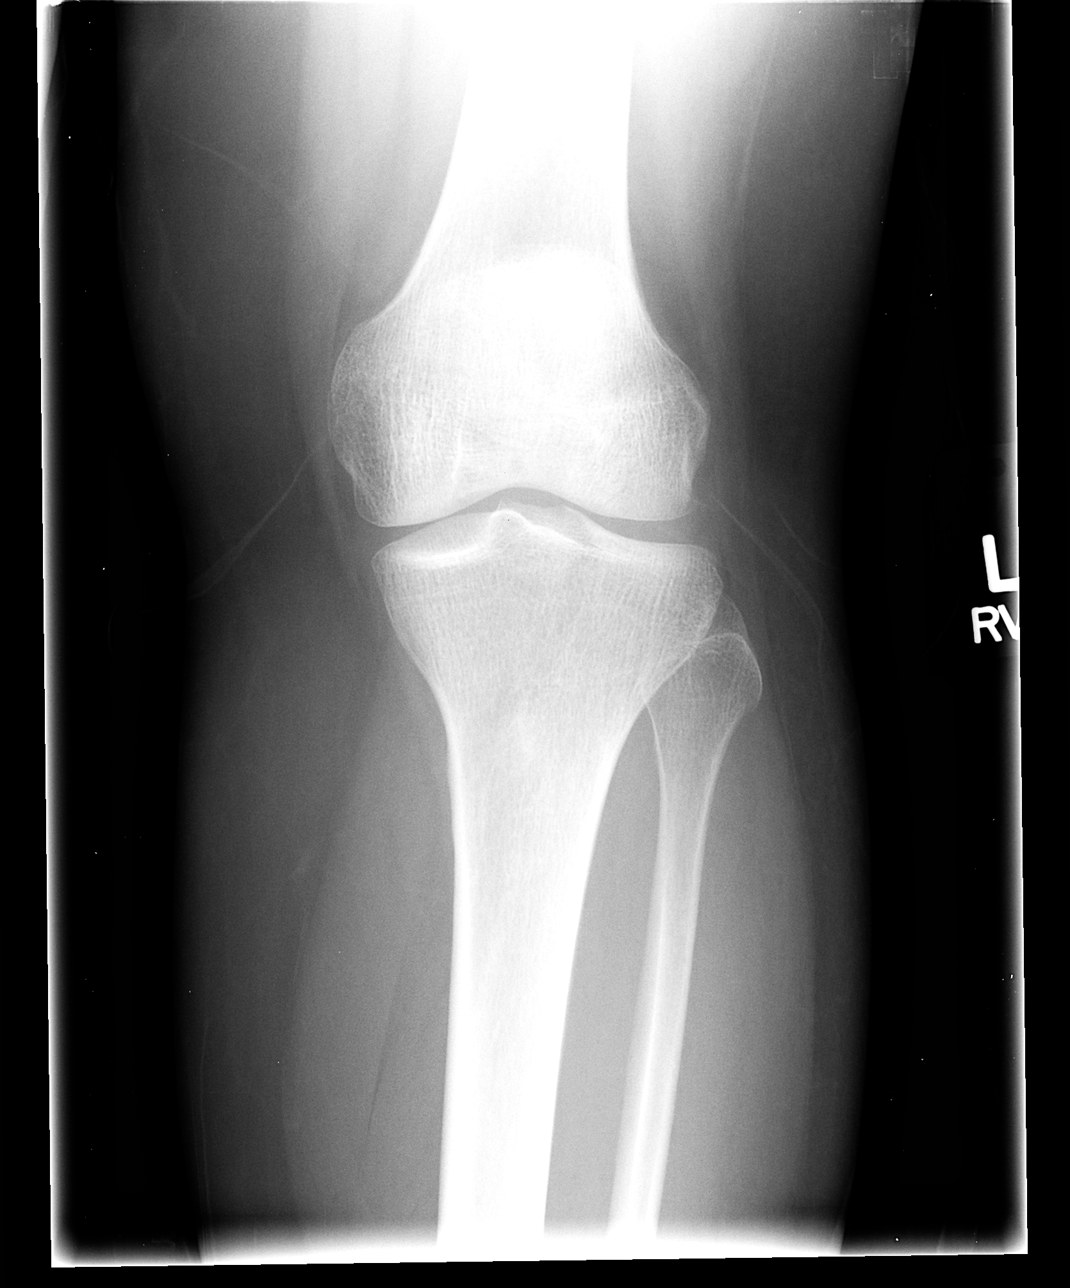

[view not recorded (2 of 4)]
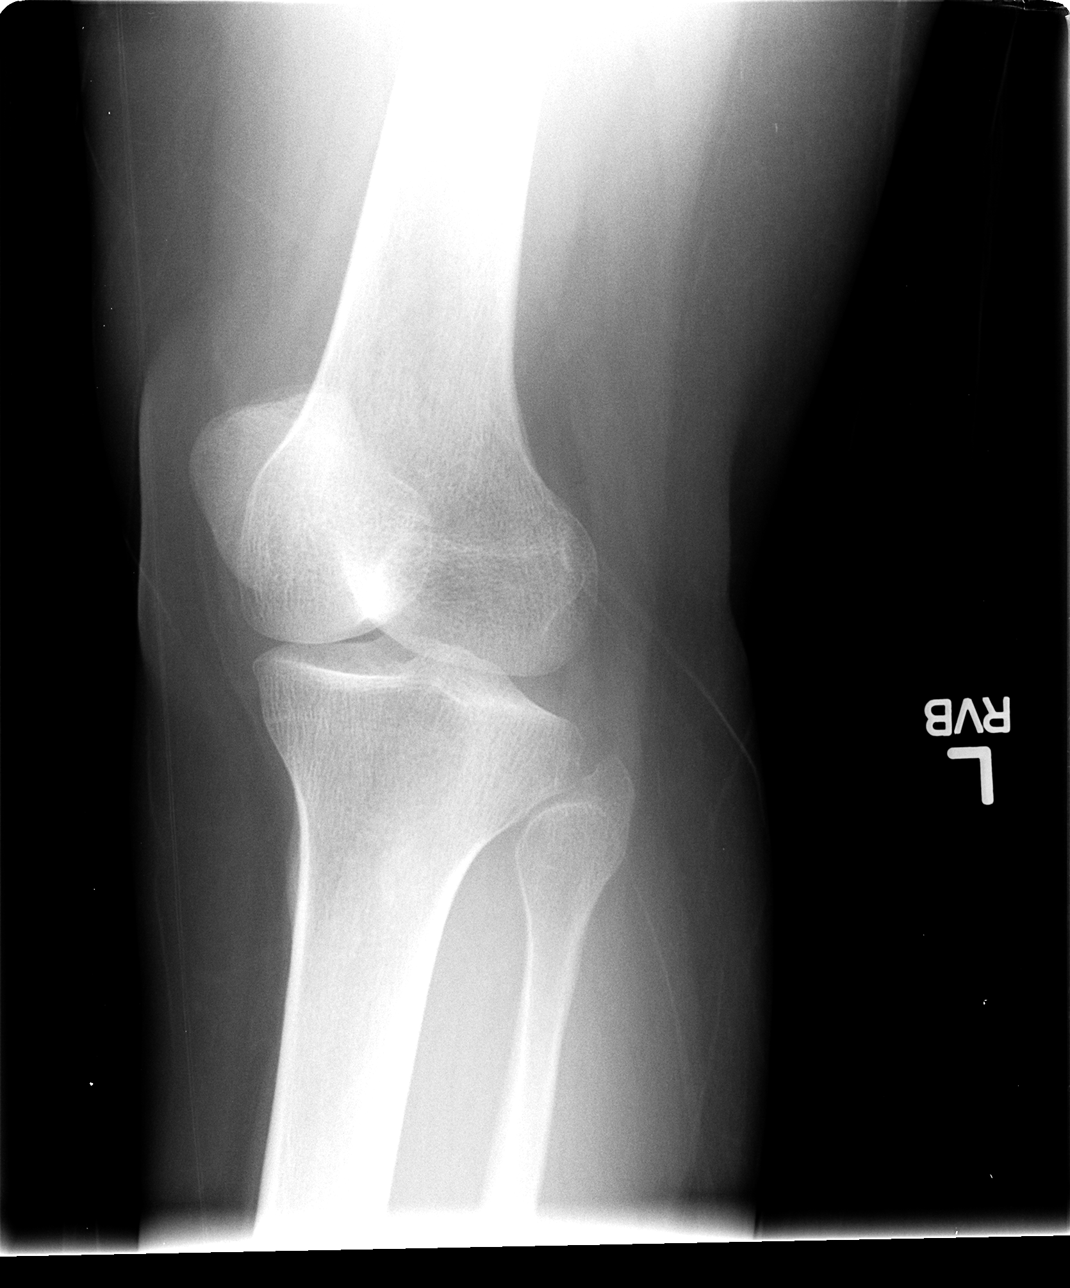

[view not recorded (3 of 4)]
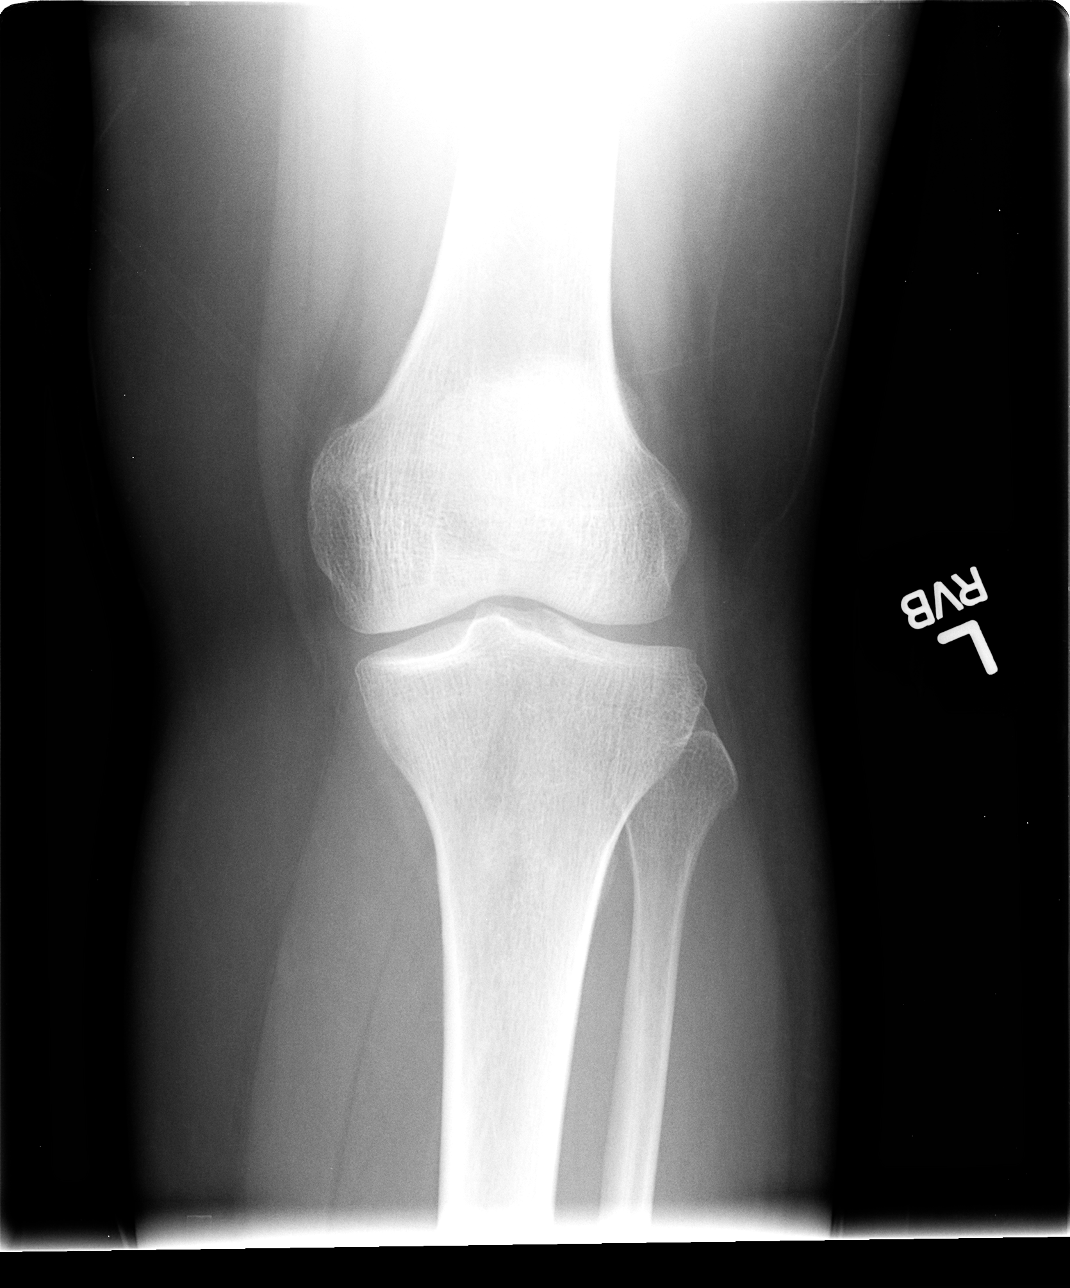

[view not recorded (4 of 4)]
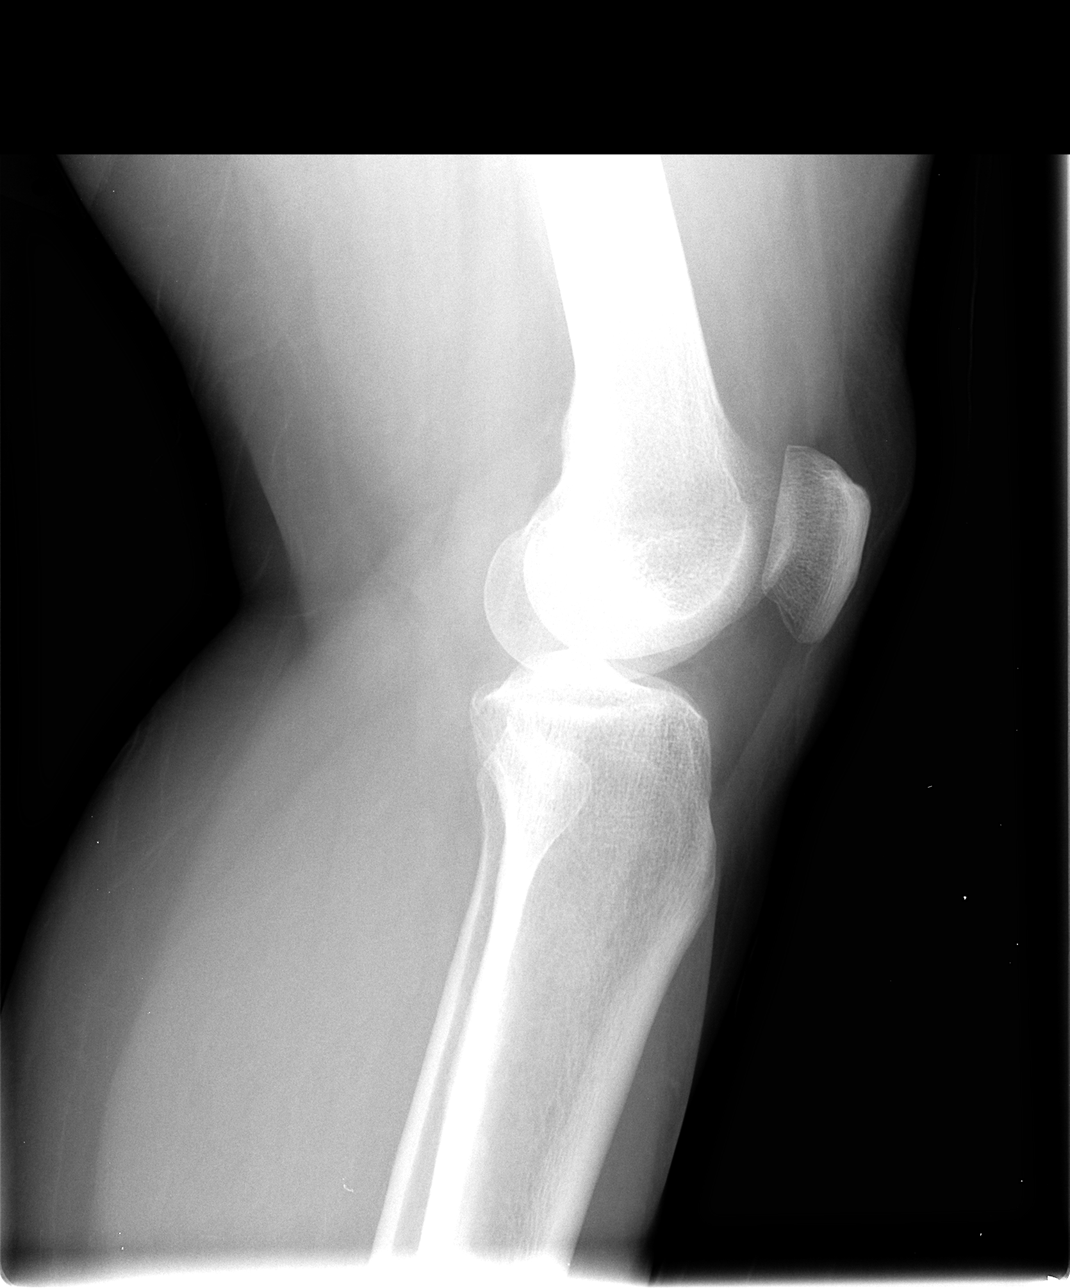

[4 of 4 positions shown; findings below may reference images not displayed]

FINDINGS: There is no evidence of fracture, dislocation, or joint
effusion.  There is no evidence of arthropathy or other focal bone
abnormality.  Soft tissues are unremarkable.
IMPRESSION: Negative.

## 2014-03-31 IMAGING — CR DG WRIST COMPLETE 3+V*R*
2 series · 2 of 2 positions shown · non-contrast
Comparison: None.

CLINICAL DATA: Joint pain.

RIGHT WRIST - COMPLETE 3+ VIEW

[view not recorded (1 of 2)]
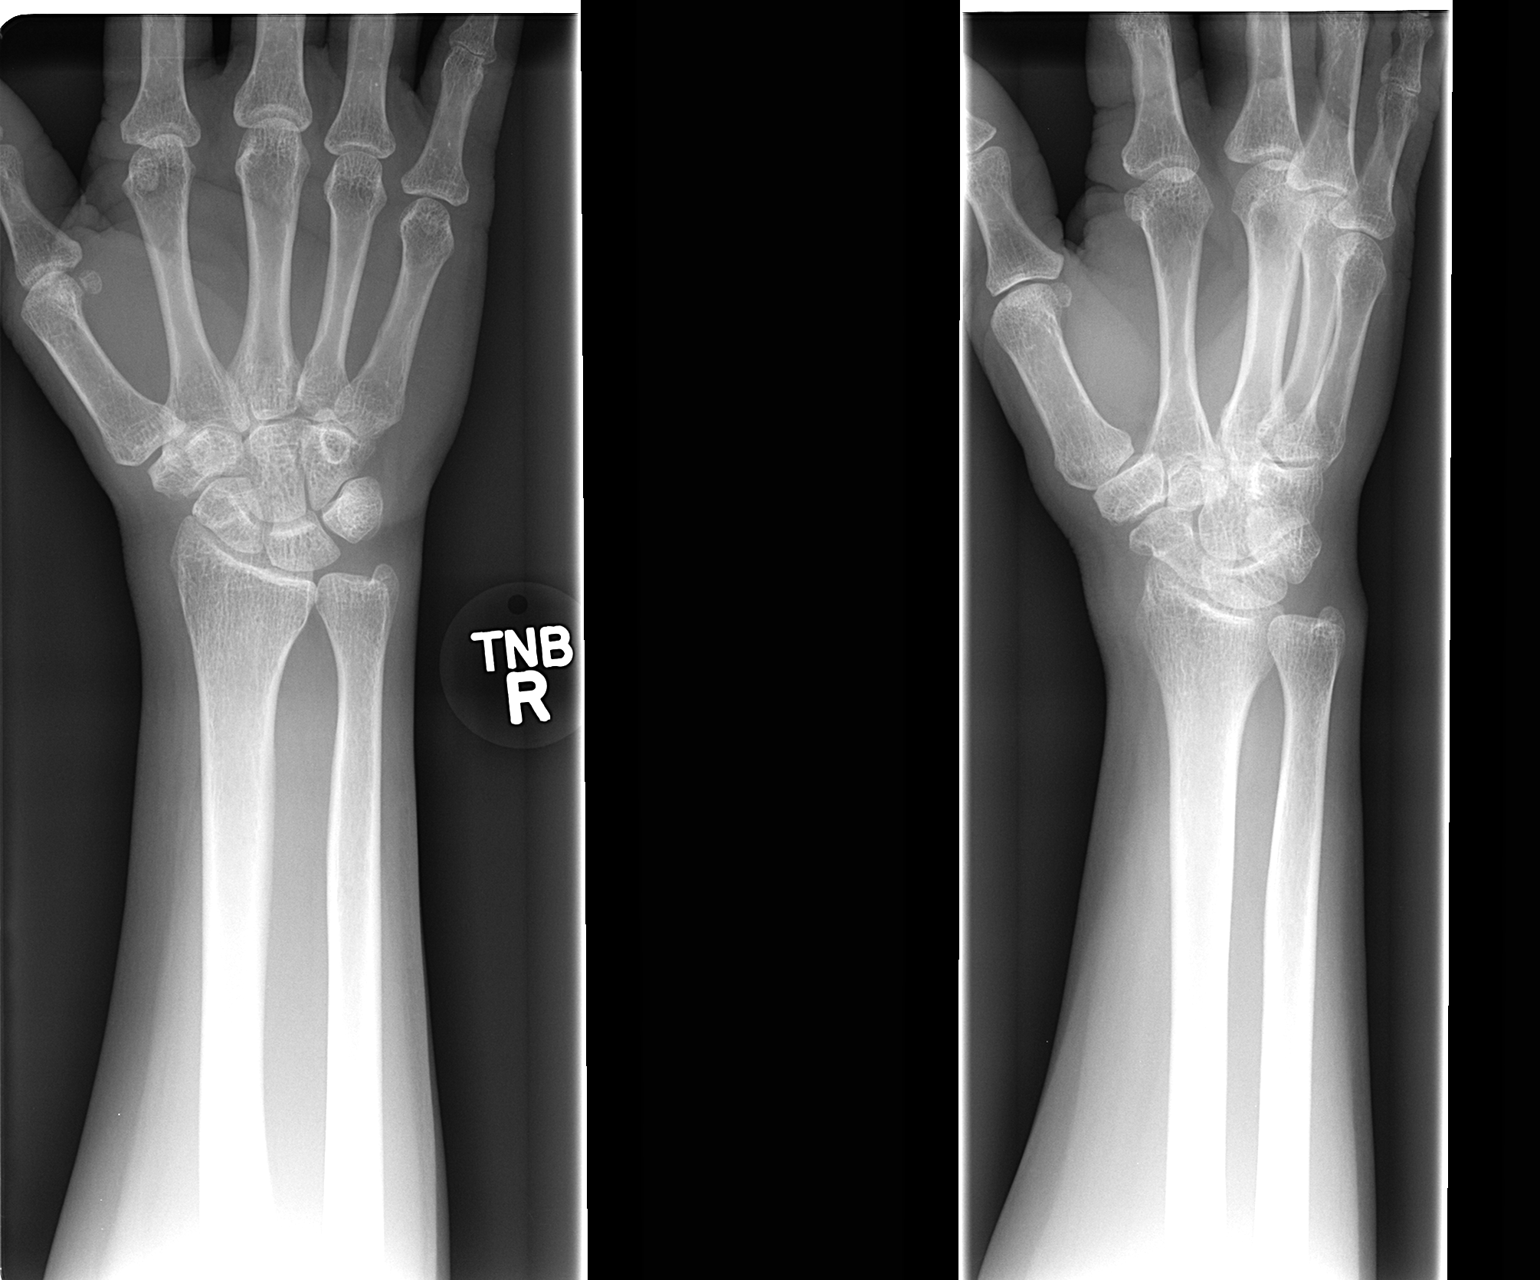

[view not recorded (2 of 2)]
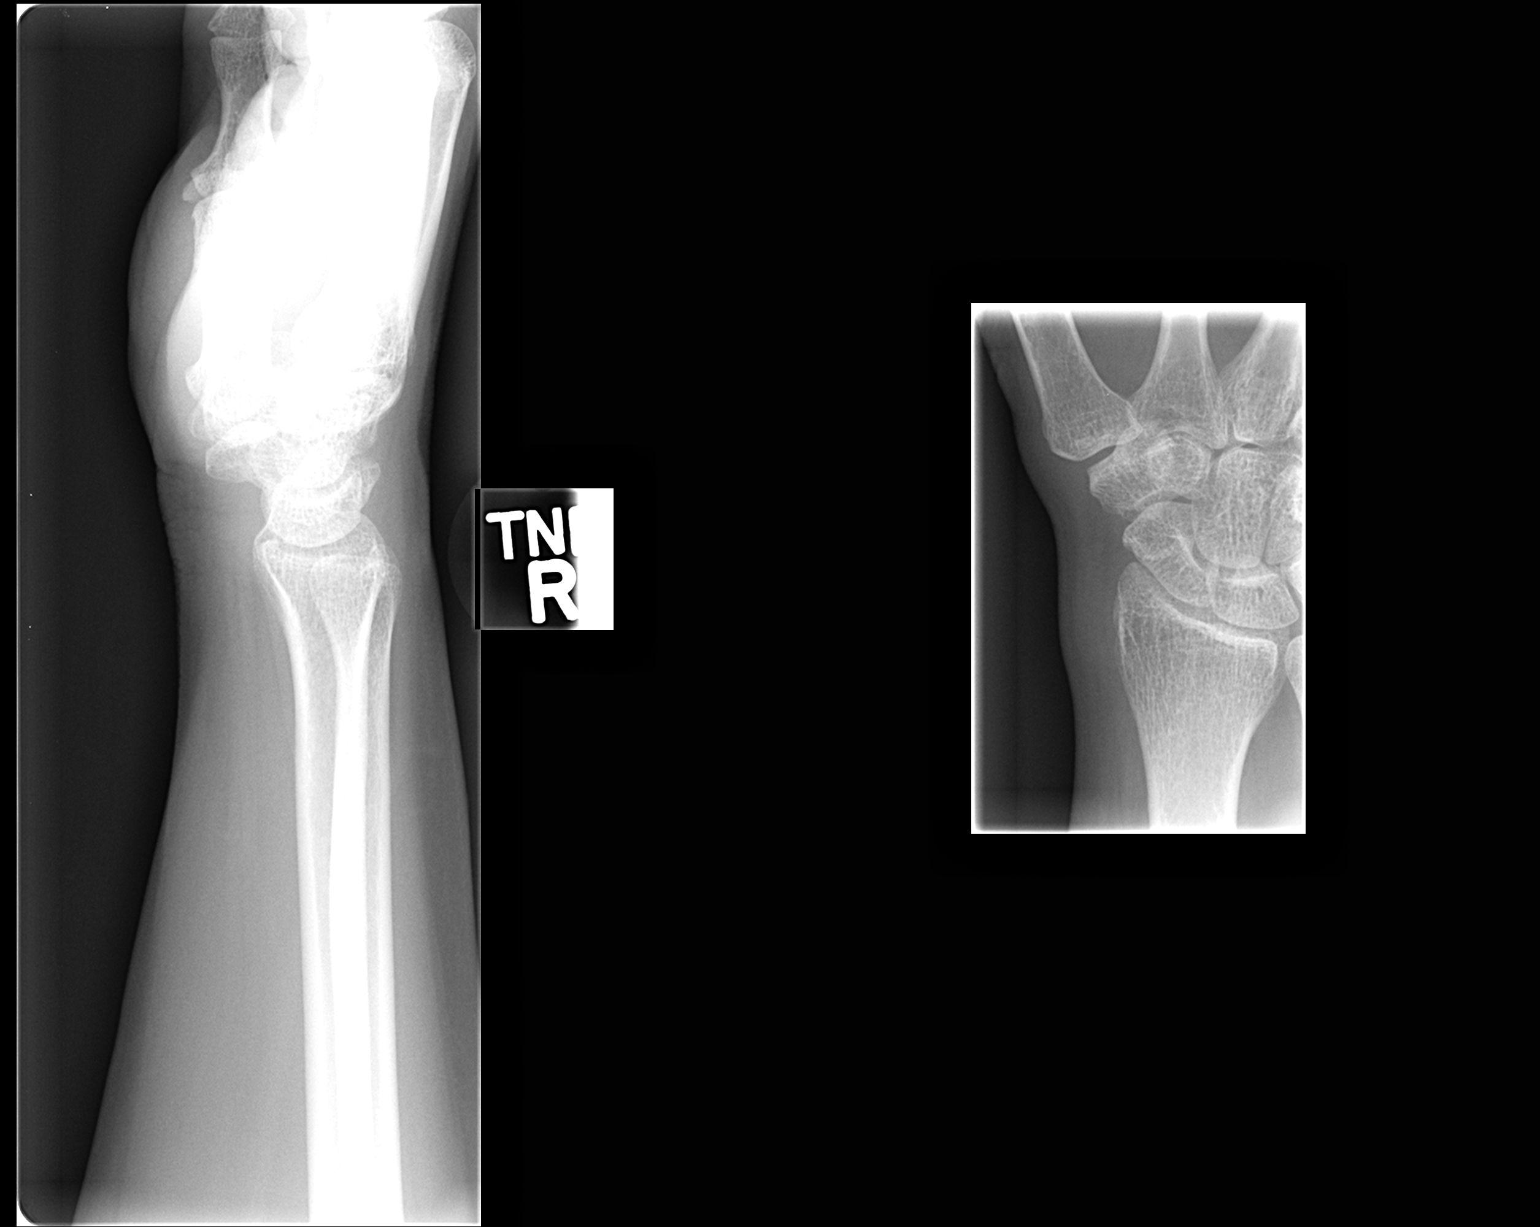

[2 of 2 positions shown; findings below may reference images not displayed]

FINDINGS: No acute bony abnormality.  Specifically, no fracture,
subluxation, or dislocation.  Soft tissues are intact.  Mild joint
space narrowing within the radiocarpal joint.  No bony erosions.
IMPRESSION: No acute bony abnormality.

## 2014-12-12 ENCOUNTER — Encounter (HOSPITAL_COMMUNITY): Payer: Self-pay | Admitting: Emergency Medicine

## 2014-12-12 ENCOUNTER — Emergency Department (HOSPITAL_COMMUNITY)
Admission: EM | Admit: 2014-12-12 | Discharge: 2014-12-12 | Disposition: A | Discharge status: Home / Self Care | Payer: 59 | Attending: Emergency Medicine | Admitting: Emergency Medicine

## 2014-12-12 DIAGNOSIS — Z791 Long term (current) use of non-steroidal anti-inflammatories (NSAID): Secondary | ICD-10-CM | POA: Diagnosis not present

## 2014-12-12 DIAGNOSIS — J029 Acute pharyngitis, unspecified: Secondary | ICD-10-CM | POA: Diagnosis present

## 2014-12-12 DIAGNOSIS — I10 Essential (primary) hypertension: Secondary | ICD-10-CM | POA: Diagnosis not present

## 2014-12-12 DIAGNOSIS — E780 Pure hypercholesterolemia: Secondary | ICD-10-CM | POA: Insufficient documentation

## 2014-12-12 DIAGNOSIS — J020 Streptococcal pharyngitis: Secondary | ICD-10-CM | POA: Insufficient documentation

## 2014-12-12 DIAGNOSIS — Z79899 Other long term (current) drug therapy: Secondary | ICD-10-CM | POA: Insufficient documentation

## 2014-12-12 DIAGNOSIS — G43909 Migraine, unspecified, not intractable, without status migrainosus: Secondary | ICD-10-CM | POA: Insufficient documentation

## 2014-12-12 MED ORDER — HYDROCODONE-ACETAMINOPHEN 5-325 MG PO TABS
1.0000 | ORAL_TABLET | Freq: Once | ORAL | Status: AC
  Administered 2014-12-12: 1 via ORAL
  Filled 2014-12-12: qty 1

## 2014-12-12 MED ORDER — IBUPROFEN 800 MG PO TABS: 800.0000 mg | ORAL_TABLET | Freq: Three times a day (TID) | ORAL | Status: DC

## 2014-12-12 MED ORDER — IBUPROFEN 800 MG PO TABS
800.0000 mg | ORAL_TABLET | Freq: Once | ORAL | Status: AC
  Administered 2014-12-12: 800 mg via ORAL
  Filled 2014-12-12: qty 1

## 2014-12-12 MED ORDER — PENICILLIN G BENZATHINE 1200000 UNIT/2ML IM SUSP
1.2000 10*6.[IU] | Freq: Once | INTRAMUSCULAR | Status: AC
  Administered 2014-12-12: 1.2 10*6.[IU] via INTRAMUSCULAR
  Filled 2014-12-12: qty 2

## 2014-12-12 MED ORDER — HYDROCODONE-ACETAMINOPHEN 5-325 MG PO TABS: 1.0000 | ORAL_TABLET | ORAL | Status: DC | PRN

## 2014-12-12 NOTE — Discharge Instructions (Signed)
Please wash hands frequently. Please use a mask until symptoms have resolved. Salt water gargles maybe helpful. You were treated in the emergency department with Bicillin. Please use ibuprofen 3 times daily with food. May use Norco for more severe pain or headache. Norco may cause drowsiness, please use with caution. Pharyngitis Pharyngitis is a sore throat (pharynx). There is redness, pain, and swelling of your throat. HOME CARE   Drink enough fluids to keep your pee (urine) clear or pale yellow.  Only take medicine as told by your doctor.  You may get sick again if you do not take medicine as told. Finish your medicines, even if you start to feel better.  Do not take aspirin.  Rest.  Rinse your mouth (gargle) with salt water ( tsp of salt per 1 qt of water) every 1-2 hours. This will help the pain.  If you are not at risk for choking, you can suck on hard candy or sore throat lozenges. GET HELP IF:  You have large, tender lumps on your neck.  You have a rash.  You cough up green, yellow-brown, or bloody spit. GET HELP RIGHT AWAY IF:   You have a stiff neck.  You drool or cannot swallow liquids.  You throw up (vomit) or are not able to keep medicine or liquids down.  You have very bad pain that does not go away with medicine.  You have problems breathing (not from a stuffy nose). MAKE SURE YOU:   Understand these instructions.  Will watch your condition.  Will get help right away if you are not doing well or get worse. Document Released: 04/10/2008 Document Revised: 08/13/2013 Document Reviewed: 06/30/2013 [facility] Patient Information 2015 [place]. This information is not intended to replace advice given to you by your health care provider. Make sure you discuss any questions you have with your health care provider.

## 2014-12-12 NOTE — ED Provider Notes (Signed)
CSN: [id number]     Arrival date & time 12/12/14  1424 History   First MD Initiated Contact with Patient 12/12/14 1503     Chief Complaint  Patient presents with  . Sore Throat     (Consider location/radiation/quality/duration/timing/severity/associated sxs/prior Treatment) Patient is a 49 y.o. female presenting with pharyngitis. The history is provided by the patient.  Sore Throat This is a new problem. The current episode started in the past 7 days. The problem occurs intermittently. The problem has been gradually worsening. Associated symptoms include headaches and a sore throat. Pertinent negatives include no abdominal pain, arthralgias, chest pain, coughing, neck pain or rash. The symptoms are aggravated by swallowing. She has tried acetaminophen for the symptoms. The treatment provided no relief.    Past Medical History  Diagnosis Date  . Migraines   . High cholesterol   . Hypertension    History reviewed. No pertinent past surgical history. No family history on file. History  Substance Use Topics  . Smoking status: Never Smoker   . Smokeless tobacco: Not on file  . Alcohol Use: No   OB History    Gravida Para Term Preterm AB TAB SAB Ectopic Multiple Living            1     Review of Systems  Constitutional: Negative for activity change.       All ROS Neg except as noted in HPI  HENT: Positive for sore throat. Negative for nosebleeds.   Eyes: Negative for photophobia and discharge.  Respiratory: Negative for cough, shortness of breath and wheezing.   Cardiovascular: Negative for chest pain and palpitations.  Gastrointestinal: Negative for abdominal pain and blood in stool.  Genitourinary: Negative for dysuria, frequency and hematuria.  Musculoskeletal: Negative for back pain, arthralgias and neck pain.  Skin: Negative.  Negative for rash.  Neurological: Positive for headaches. Negative for dizziness, seizures and speech difficulty.  Psychiatric/Behavioral: Negative  for hallucinations and confusion.      Allergies  Review of patient's allergies indicates no known allergies.  Home Medications   Prior to Admission medications   Medication Sig Start Date End Date Taking? Authorizing Provider  atorvastatin (LIPITOR) 40 MG tablet Take 20 mg by mouth at bedtime.    Historical Provider, MD  cyclobenzaprine (FLEXERIL) 5 MG tablet Take 1 tablet (5 mg total) by mouth 3 (three) times daily as needed for muscle spasms. 07/28/12   Burgess AmorJulie Idol, PA-C  lisinopril (PRINIVIL,ZESTRIL) 10 MG tablet Take 10 mg by mouth daily.    Historical Provider, MD  meloxicam (MOBIC) 7.5 MG tablet Take 7.5 mg by mouth 2 (two) times daily.    Historical Provider, MD  nabumetone (RELAFEN) 500 MG tablet Take 500 mg by mouth daily. Two tablets by mouth once daily with food    Historical Provider, MD  topiramate (TOPAMAX) 25 MG tablet Take 25 mg by mouth at bedtime.    Historical Provider, MD  traMADol (ULTRAM) 50 MG tablet Take 50 mg by mouth every 6 (six) hours as needed. Pain    Historical Provider, MD   BP 129/85 mmHg  Pulse 88  Temp(Src) 99 F (37.2 C) (Oral)  Resp 18  Ht 5\' 1"  (1.549 m)  Wt 167 lb (75.751 kg)  BMI 31.57 kg/m2  SpO2 100% Physical Exam  Constitutional: She is oriented to person, place, and time. She appears well-developed and well-nourished.  Non-toxic appearance.  HENT:  Head: Normocephalic.  Right Ear: Tympanic membrane and external ear normal.  Left  Ear: Tympanic membrane and external ear normal.  Mouth/Throat: Uvula is midline. Uvula swelling present. Posterior oropharyngeal erythema present. No tonsillar abscesses.  Eyes: EOM and lids are normal. Pupils are equal, round, and reactive to light.  Neck: Normal range of motion. Neck supple. Carotid bruit is not present.  Submental and cervical lymphadenopathy present.  Cardiovascular: Normal rate, regular rhythm, normal heart sounds, intact distal pulses and normal pulses.   Pulmonary/Chest: Breath sounds  normal. No respiratory distress.  Abdominal: Soft. Bowel sounds are normal. There is no tenderness. There is no guarding.  Musculoskeletal: Normal range of motion.  Lymphadenopathy:       Head (right side): No submandibular adenopathy present.       Head (left side): No submandibular adenopathy present.    She has no cervical adenopathy.  Neurological: She is alert and oriented to person, place, and time. She has normal strength. No cranial nerve deficit or sensory deficit.  Skin: Skin is warm and dry.  Psychiatric: She has a normal mood and affect. Her speech is normal.  Nursing note and vitals reviewed.   ED Course  Procedures (including critical care time) Labs Review Labs Reviewed - No data to display  Imaging Review No results found.   EKG Interpretation None      MDM  No rash appreciated. Patient gives history of chills and headache and difficulty with swallowing. No neck stiffness reported. No difficulty with breathing reported.  Patient treated with Bicillin, ibuprofen, and Norco. Patient is to see her primary physician or return to the emergency department if any changes, problems, or concerns.    Final diagnoses:  None    *I have reviewed nursing notes, vital signs, and all appropriate lab and imaging results for this patient.Kathie Dike, PA-C 12/12/14 1534  Rolland Porter, MD 12/13/14 [phone]

## 2014-12-12 NOTE — ED Notes (Addendum)
PT c/o sore throat x2 days with no other complaints. PT's throat swollen and red on exam. PT stated she took tylenol at 0900 this am.

## 2015-06-18 ENCOUNTER — Other Ambulatory Visit (HOSPITAL_COMMUNITY): Payer: Self-pay | Admitting: Physician Assistant

## 2015-06-18 DIAGNOSIS — Z1231 Encounter for screening mammogram for malignant neoplasm of breast: Secondary | ICD-10-CM

## 2015-06-28 ENCOUNTER — Ambulatory Visit (HOSPITAL_COMMUNITY): Payer: 59

## 2019-07-25 ENCOUNTER — Other Ambulatory Visit: Payer: Self-pay

## 2019-07-25 DIAGNOSIS — Z20822 Contact with and (suspected) exposure to covid-19: Secondary | ICD-10-CM

## 2019-07-26 LAB — NOVEL CORONAVIRUS, NAA: SARS-CoV-2, NAA: NOT DETECTED

## 2020-03-12 ENCOUNTER — Other Ambulatory Visit: Payer: Self-pay

## 2020-03-12 ENCOUNTER — Ambulatory Visit
Admission: EM | Admit: 2020-03-12 | Discharge: 2020-03-12 | Disposition: A | Discharge status: Home / Self Care | Payer: 59 | Attending: Emergency Medicine | Admitting: Emergency Medicine

## 2020-03-12 DIAGNOSIS — Z20822 Contact with and (suspected) exposure to covid-19: Secondary | ICD-10-CM

## 2020-03-12 NOTE — ED Triage Notes (Signed)
Needs covid test after exposure last week, no symptoms

## 2020-03-12 NOTE — Discharge Instructions (Addendum)

## 2020-03-12 NOTE — ED Provider Notes (Signed)
[facility]    CSN: [id number] Arrival date & time: 03/12/20  1438      History   Chief Complaint Covid exposure  HPI Gloria Huerta is a 54 y.o. female.   who presents for COVID testing after Covid exposure.  Denies sick exposure to COVID, flu or strep.  Denies recent travel.  Denies aggravating or alleviating symptoms.  Denies previous COVID infection.   Denies fever, chills, fatigue, nasal congestion, rhinorrhea, sore throat, cough, SOB, wheezing, chest pain, nausea, vomiting, changes in bowel or bladder habits.    The history is provided by the patient. No language interpreter was used.    Past Medical History:  Diagnosis Date  . High cholesterol   . Hypertension   . Migraines     Patient Active Problem List   Diagnosis Date Noted  . De Quervain's syndrome (tenosynovitis) 02/04/2013    No past surgical history on file.  OB History    Gravida      Para      Term      Preterm      AB      Living  1     SAB      TAB      Ectopic      Multiple      Live Births               Home Medications    Prior to Admission medications   Medication Sig Start Date End Date Taking? Authorizing Provider  atorvastatin (LIPITOR) 40 MG tablet Take 20 mg by mouth at bedtime.    [provider]  cyclobenzaprine (FLEXERIL) 5 MG tablet Take 1 tablet (5 mg total) by mouth 3 (three) times daily as needed for muscle spasms. 07/28/12   Evalee Jefferson, PA-C  HYDROcodone-acetaminophen (NORCO/VICODIN) 5-325 MG per tablet Take 1-2 tablets by mouth every 4 (four) hours as needed. 12/12/14   Lily Kocher, PA-C  ibuprofen (ADVIL,MOTRIN) 800 MG tablet Take 1 tablet (800 mg total) by mouth 3 (three) times daily. 12/12/14   Lily Kocher, PA-C  lisinopril (PRINIVIL,ZESTRIL) 10 MG tablet Take 10 mg by mouth daily.    [provider]  meloxicam (MOBIC) 7.5 MG tablet Take 7.5 mg by mouth 2 (two) times daily.    [provider]  nabumetone  (RELAFEN) 500 MG tablet Take 500 mg by mouth daily. Two tablets by mouth once daily with food    [provider]  topiramate (TOPAMAX) 25 MG tablet Take 25 mg by mouth at bedtime.    [provider]  traMADol (ULTRAM) 50 MG tablet Take 50 mg by mouth every 6 (six) hours as needed. Pain    [provider]    Family History No family history on file.  Social History Social History   Tobacco Use  . Smoking status: Never Smoker  Substance Use Topics  . Alcohol use: No  . Drug use: No     Allergies   Patient has no known allergies.   Review of Systems Review of Systems  Constitutional: Negative.   HENT: Negative.   Respiratory: Negative.   Cardiovascular: Negative.   Gastrointestinal: Negative.   Neurological: Negative.      Physical Exam Triage Vital Signs ED Triage Vitals  Enc Vitals Group     BP      Pulse      Resp      Temp      Temp src  SpO2      Weight      Height      Head Circumference      Peak Flow      Pain Score      Pain Loc      Pain Edu?      Excl. in GC?    No data found.  Updated Vital Signs There were no vitals taken for this visit.  Visual Acuity Right Eye Distance:   Left Eye Distance:   Bilateral Distance:    Right Eye Near:   Left Eye Near:    Bilateral Near:     Physical Exam Vitals and nursing note reviewed.  Constitutional:      General: She is not in acute distress.    Appearance: Normal appearance. She is normal weight. She is not ill-appearing or toxic-appearing.  HENT:     Head: Normocephalic.     Right Ear: Tympanic membrane, ear canal and external ear normal. There is no impacted cerumen.     Left Ear: Tympanic membrane, ear canal and external ear normal. There is no impacted cerumen.     Nose: Nose normal. No congestion.     Mouth/Throat:     Mouth: Mucous membranes are moist.     Pharynx: Oropharynx is clear. No oropharyngeal exudate or posterior oropharyngeal erythema.    Cardiovascular:     Rate and Rhythm: Normal rate and regular rhythm.     Pulses: Normal pulses.     Heart sounds: Normal heart sounds. No murmur.  Pulmonary:     Effort: Pulmonary effort is normal. No respiratory distress.     Breath sounds: Normal breath sounds. No wheezing or rhonchi.  Chest:     Chest wall: No tenderness.  Skin:    Capillary Refill: Capillary refill takes less than 2 seconds.  Neurological:     General: No focal deficit present.     Mental Status: She is alert and oriented to person, place, and time.      UC Treatments / Results  Labs (all labs ordered are listed, but only abnormal results are displayed) Labs Reviewed  NOVEL CORONAVIRUS, NAA    EKG   Radiology No results found.  Procedures Procedures (including critical care time)  Medications Ordered in UC Medications - No data to display  Initial Impression / Assessment and Plan / UC Course  I have reviewed the triage vital signs and the nursing notes.  Pertinent labs & imaging results that were available during my care of the patient were reviewed by me and considered in my medical decision making (see chart for details).    Patient is stable for discharge.  Was advised to quarantine until COVID-19 test result become available.  Final Clinical Impressions(s) / UC Diagnoses   Final diagnoses:  Exposure to COVID-19 virus     Discharge Instructions     COVID testing ordered.  It will take between 2-7 days for test results.  Someone will contact you regarding abnormal results.    In the meantime: You should remain isolated in your home for 10 days from symptom onset AND greater than 24 hours after symptoms resolution (absence of fever without the use of fever-reducing medication and improvement in respiratory symptoms), whichever is longer Get plenty of rest and push fluids Use medications daily for symptom relief Use OTC medications like ibuprofen or tylenol as needed fever or  pain Call or go to the ED if you have any new or worsening symptoms  such as fever, worsening cough, shortness of breath, chest tightness, chest pain, turning blue, changes in mental status, etc...     ED Prescriptions    None     PDMP not reviewed this encounter.   Durward Parcel, FNP 03/12/20 [phone]

## 2020-03-13 LAB — SARS-COV-2, NAA 2 DAY TAT

## 2020-03-13 LAB — NOVEL CORONAVIRUS, NAA: SARS-CoV-2, NAA: NOT DETECTED

## 2022-09-06 ENCOUNTER — Ambulatory Visit: Payer: Self-pay | Admitting: Internal Medicine

## 2022-09-07 ENCOUNTER — Ambulatory Visit (INDEPENDENT_AMBULATORY_CARE_PROVIDER_SITE_OTHER): Payer: 59 | Admitting: Internal Medicine

## 2022-09-07 ENCOUNTER — Encounter: Payer: Self-pay | Admitting: Internal Medicine

## 2022-09-07 VITALS — BP 136/84 | HR 84 | Resp 18 | Ht 62.0 in | Wt 173.2 lb

## 2022-09-07 DIAGNOSIS — E559 Vitamin D deficiency, unspecified: Secondary | ICD-10-CM | POA: Diagnosis not present

## 2022-09-07 DIAGNOSIS — I10 Essential (primary) hypertension: Secondary | ICD-10-CM | POA: Diagnosis not present

## 2022-09-07 DIAGNOSIS — Z5320 Procedure and treatment not carried out because of patient's decision for unspecified reasons: Secondary | ICD-10-CM | POA: Diagnosis not present

## 2022-09-07 DIAGNOSIS — B078 Other viral warts: Secondary | ICD-10-CM

## 2022-09-07 DIAGNOSIS — Z1159 Encounter for screening for other viral diseases: Secondary | ICD-10-CM | POA: Diagnosis not present

## 2022-09-07 DIAGNOSIS — Z23 Encounter for immunization: Secondary | ICD-10-CM | POA: Diagnosis not present

## 2022-09-07 DIAGNOSIS — E782 Mixed hyperlipidemia: Secondary | ICD-10-CM | POA: Diagnosis not present

## 2022-09-07 DIAGNOSIS — R69 Illness, unspecified: Secondary | ICD-10-CM | POA: Diagnosis not present

## 2022-09-07 DIAGNOSIS — Z114 Encounter for screening for human immunodeficiency virus [HIV]: Secondary | ICD-10-CM | POA: Diagnosis not present

## 2022-09-07 DIAGNOSIS — Z0001 Encounter for general adult medical examination with abnormal findings: Secondary | ICD-10-CM

## 2022-09-07 DIAGNOSIS — R739 Hyperglycemia, unspecified: Secondary | ICD-10-CM | POA: Diagnosis not present

## 2022-09-07 NOTE — Assessment & Plan Note (Addendum)
Was on Lipitor Check lipid profile

## 2022-09-07 NOTE — Assessment & Plan Note (Signed)

## 2022-09-07 NOTE — Patient Instructions (Signed)
Please continue to follow DASH diet and perform moderate exercise/walking at least 150 mins/week.  Please apply Compound W for lesion on hand. Please contact us if it does not improve in 1 month.

## 2022-09-07 NOTE — Assessment & Plan Note (Addendum)
Palmar skin lesion likely wart Advised to apply Compound W If persistent, will refer to dermatology

## 2022-09-07 NOTE — Progress Notes (Signed)
New Patient Office Visit  Subjective:  Patient ID: Gloria Huerta, female    DOB: 19-Aug-1966  Age: 56 y.o. MRN: [id number]  CC:  Chief Complaint  Patient presents with   New Patient (Initial Visit)    New patient was being seen at [facility]  has spot on right hand that is sore been there for 2 months     HPI Gloria Huerta is a 56 y.o. female with past medical history of HTN, HLD and migraine who presents for establishing care.  However BP is well controlled currently.  She was on lisinopril in the past, but was weaned off of it due to her lifestyle modification.  She currently denies any headache, dizziness, chest pain, dyspnea or palpitations.  She was on atorvastatin for HLD in the past.  She used to take topiramate for migraine.  Her headache frequency have decreased now.  She usually takes Tylenol as needed for headache now.  Denies any dizziness or nausea currently.  She has a painful skin lesion on right hand for for the last 2 months.  Denies any bleeding or discharge from the site.  She has not had COVID vaccine.  She denies PAP smear, mammography and colon cancer screening test.   Past Medical History:  Diagnosis Date   High cholesterol    Hypertension    Migraines     History reviewed. No pertinent surgical history.  History reviewed. No pertinent family history.  Social History   Socioeconomic History   Marital status: Single    Spouse name: Not on file   Number of children: Not on file   Years of education: Not on file   Highest education level: Not on file  Occupational History   Not on file  Tobacco Use   Smoking status: Never   Smokeless tobacco: Not on file  Substance and Sexual Activity   Alcohol use: No   Drug use: No   Sexual activity: Yes    Birth control/protection: None  Other Topics Concern   Not on file  Social History Narrative   Not on file   Social Determinants of Health   Financial Resource Strain: Not on file  Food  Insecurity: Not on file  Transportation Needs: Not on file  Physical Activity: Not on file  Stress: Not on file  Social Connections: Not on file  Intimate Partner Violence: Not on file    ROS Review of Systems  Constitutional:  Negative for chills and fever.  HENT:  Negative for congestion, sinus pressure, sinus pain and sore throat.   Eyes:  Negative for pain and discharge.  Respiratory:  Negative for cough and shortness of breath.   Cardiovascular:  Negative for chest pain and palpitations.  Gastrointestinal:  Negative for abdominal pain, constipation, diarrhea, nausea and vomiting.  Endocrine: Negative for polydipsia and polyuria.  Genitourinary:  Negative for dysuria and hematuria.  Musculoskeletal:  Negative for neck pain and neck stiffness.  Skin:  Negative for rash.       Sore on right hand  Neurological:  Positive for headaches. Negative for dizziness and weakness.  Psychiatric/Behavioral:  Negative for agitation and behavioral problems.     Objective:   Today's Vitals: BP 136/84 (BP Location: Right Arm, Patient Position: Sitting, Cuff Size: Normal)   Pulse 84   Resp 18   Ht _0  (1.575 m)   Wt 173 lb 3.2 oz (78.6 kg)   SpO2 98%   BMI 31.68 kg/m   Physical  Exam Vitals reviewed.  Constitutional:      General: She is not in acute distress.    Appearance: She is not diaphoretic.  HENT:     Head: Normocephalic and atraumatic.     Nose: Nose normal.     Mouth/Throat:     Mouth: Mucous membranes are moist.  Eyes:     General: No scleral icterus.    Extraocular Movements: Extraocular movements intact.  Cardiovascular:     Rate and Rhythm: Normal rate and regular rhythm.     Heart sounds: Normal heart sounds. No murmur heard. Pulmonary:     Breath sounds: Normal breath sounds. No wheezing or rales.  Abdominal:     Palpations: Abdomen is soft.     Tenderness: There is no abdominal tenderness.  Musculoskeletal:     Cervical back: Neck supple. No tenderness.      Right lower leg: No edema.     Left lower leg: No edema.  Skin:    General: Skin is warm.     Findings: Lesion (Palmar wart -near interdigital space between index and middle finger) present. No rash.  Neurological:     General: No focal deficit present.     Mental Status: She is alert and oriented to person, place, and time.  Psychiatric:        Mood and Affect: Mood normal.        Behavior: Behavior normal.     Assessment & Plan:   Problem List Items Addressed This Visit       Cardiovascular and Mediastinum   Essential hypertension    BP Readings from Last 1 Encounters:  09/07/22 136/84  Well-controlled with diet modification Was on Lisinopril in the past Advised DASH diet and moderate exercise/walking, at least 150 mins/week       Relevant Orders   TSH   CMP14+EGFR   CBC with Differential/Platelet     Musculoskeletal and Integument   Palmar wart    Palmar skin lesion likely wart Advised to apply Compound W If persistent, will refer to dermatology        Other   Encounter for general adult medical examination with abnormal findings - Primary    Physical exam as documented. Counseling done  re healthy lifestyle involving commitment to 150 minutes exercise per week, heart healthy diet, and attaining healthy weight.The importance of adequate sleep also discussed. Changes in health habits are decided on by the patient with goals and time frames  set for achieving them. Immunization and cancer screening needs are specifically addressed at this visit.      Relevant Orders   TSH   Lipid panel   CMP14+EGFR   CBC with Differential/Platelet   Mixed hyperlipidemia    Was on Lipitor Check lipid profile      Relevant Orders   Lipid panel   Other Visit Diagnoses     Need for immunization against influenza       Relevant Orders   Flu Vaccine QUAD 8moIM (Fluarix, Fluzone & Alfiuria Quad PF) (Completed)   Vitamin D deficiency       Relevant Orders   VITAMIN D  25 Hydroxy (Vit-D Deficiency, Fractures)   Hyperglycemia       Relevant Orders   Hemoglobin A1c   Need for hepatitis C screening test       Relevant Orders   HIV antibody (with reflex)   Encounter for screening for HIV       Relevant Orders   Hepatitis C  Antibody   Mammogram declined       Colon cancer screening declined           Outpatient Encounter Medications as of 09/07/2022  Medication Sig   lisinopril (PRINIVIL,ZESTRIL) 10 MG tablet Take 10 mg by mouth daily. (Patient not taking: Reported on 09/07/2022)   [DISCONTINUED] atorvastatin (LIPITOR) 40 MG tablet Take 20 mg by mouth at bedtime. (Patient not taking: Reported on 09/07/2022)   [DISCONTINUED] cyclobenzaprine (FLEXERIL) 5 MG tablet Take 1 tablet (5 mg total) by mouth 3 (three) times daily as needed for muscle spasms. (Patient not taking: Reported on 09/07/2022)   [DISCONTINUED] HYDROcodone-acetaminophen (NORCO/VICODIN) 5-325 MG per tablet Take 1-2 tablets by mouth every 4 (four) hours as needed. (Patient not taking: Reported on 09/07/2022)   [DISCONTINUED] ibuprofen (ADVIL,MOTRIN) 800 MG tablet Take 1 tablet (800 mg total) by mouth 3 (three) times daily. (Patient not taking: Reported on 09/07/2022)   [DISCONTINUED] meloxicam (MOBIC) 7.5 MG tablet Take 7.5 mg by mouth 2 (two) times daily. (Patient not taking: Reported on 09/07/2022)   [DISCONTINUED] nabumetone (RELAFEN) 500 MG tablet Take 500 mg by mouth daily. Two tablets by mouth once daily with food (Patient not taking: Reported on 09/07/2022)   [DISCONTINUED] topiramate (TOPAMAX) 25 MG tablet Take 25 mg by mouth at bedtime. (Patient not taking: Reported on 09/07/2022)   [DISCONTINUED] traMADol (ULTRAM) 50 MG tablet Take 50 mg by mouth every 6 (six) hours as needed. Pain (Patient not taking: Reported on 09/07/2022)   No facility-administered encounter medications on file as of 09/07/2022.    Follow-up: Return in about 6 months (around 03/08/2023).   Lindell Spar, MD

## 2022-09-07 NOTE — Assessment & Plan Note (Signed)
BP Readings from Last 1 Encounters:  09/07/22 136/84   Well-controlled with diet modification Was on Lisinopril in the past Advised DASH diet and moderate exercise/walking, at least 150 mins/week

## 2022-09-08 ENCOUNTER — Telehealth: Payer: Self-pay | Admitting: Internal Medicine

## 2022-09-08 LAB — CMP14+EGFR
ALT: 13 IU/L (ref 0–32)
AST: 20 IU/L (ref 0–40)
Albumin/Globulin Ratio: 1.5 (ref 1.2–2.2)
Albumin: 4.7 g/dL (ref 3.8–4.9)
Alkaline Phosphatase: 144 IU/L — ABNORMAL HIGH (ref 44–121)
BUN/Creatinine Ratio: 7 — ABNORMAL LOW (ref 9–23)
BUN: 7 mg/dL (ref 6–24)
Bilirubin Total: 0.8 mg/dL (ref 0.0–1.2)
CO2: 24 mmol/L (ref 20–29)
Calcium: 10.3 mg/dL — ABNORMAL HIGH (ref 8.7–10.2)
Chloride: 102 mmol/L (ref 96–106)
Creatinine, Ser: 1.01 mg/dL — ABNORMAL HIGH (ref 0.57–1.00)
Globulin, Total: 3.2 g/dL (ref 1.5–4.5)
Glucose: 96 mg/dL (ref 70–99)
Potassium: 4.6 mmol/L (ref 3.5–5.2)
Sodium: 140 mmol/L (ref 134–144)
Total Protein: 7.9 g/dL (ref 6.0–8.5)
eGFR: 65 mL/min/{1.73_m2} (ref >59)

## 2022-09-08 LAB — CBC WITH DIFFERENTIAL/PLATELET
Basophils Absolute: 0 10*3/uL (ref 0.0–0.2)
Basos: 1 %
EOS (ABSOLUTE): 0.1 10*3/uL (ref 0.0–0.4)
Eos: 3 %
Hematocrit: 41.8 % (ref 34.0–46.6)
Hemoglobin: 14.6 g/dL (ref 11.1–15.9)
Immature Grans (Abs): 0 10*3/uL (ref 0.0–0.1)
Immature Granulocytes: 0 %
Lymphocytes Absolute: 1.6 10*3/uL (ref 0.7–3.1)
Lymphs: 37 %
MCH: 30.7 pg (ref 26.6–33.0)
MCHC: 34.9 g/dL (ref 31.5–35.7)
MCV: 88 fL (ref 79–97)
Monocytes Absolute: 0.4 10*3/uL (ref 0.1–0.9)
Monocytes: 8 %
Neutrophils Absolute: 2.3 10*3/uL (ref 1.4–7.0)
Neutrophils: 51 %
Platelets: 421 10*3/uL (ref 150–450)
RBC: 4.75 x10E6/uL (ref 3.77–5.28)
RDW: 12.5 % (ref 11.7–15.4)
WBC: 4.5 10*3/uL (ref 3.4–10.8)

## 2022-09-08 LAB — HEMOGLOBIN A1C
Est. average glucose Bld gHb Est-mCnc: 108 mg/dL
Hgb A1c MFr Bld: 5.4 % (ref 4.8–5.6)

## 2022-09-08 LAB — LIPID PANEL
Chol/HDL Ratio: 4.2 ratio (ref 0.0–4.4)
Cholesterol, Total: 213 mg/dL — ABNORMAL HIGH (ref 100–199)
HDL: 51 mg/dL (ref >39)
LDL Chol Calc (NIH): 152 mg/dL — ABNORMAL HIGH (ref 0–99)
Triglycerides: 57 mg/dL (ref 0–149)
VLDL Cholesterol Cal: 10 mg/dL (ref 5–40)

## 2022-09-08 LAB — TSH: TSH: 1.48 u[IU]/mL (ref 0.450–4.500)

## 2022-09-08 LAB — HIV ANTIBODY (ROUTINE TESTING W REFLEX): HIV Screen 4th Generation wRfx: NONREACTIVE

## 2022-09-08 LAB — HEPATITIS C ANTIBODY: Hep C Virus Ab: NONREACTIVE

## 2022-09-08 LAB — VITAMIN D 25 HYDROXY (VIT D DEFICIENCY, FRACTURES): Vit D, 25-Hydroxy: 13.2 ng/mL — ABNORMAL LOW (ref 30.0–100.0)

## 2022-09-08 NOTE — Telephone Encounter (Signed)
Pt called stating she was told to take vitamins. She is wanting to know if could please write down which vitamins she needs and call her when ready?

## 2022-09-08 NOTE — Telephone Encounter (Signed)
Please let her know this can be viewed in mychart under her labs

## 2022-09-25 ENCOUNTER — Telehealth: Payer: Self-pay | Admitting: Internal Medicine

## 2022-09-25 NOTE — Telephone Encounter (Signed)
Pt came into office stating she thought she was supposed to have a RX for her BP med but phar is telling her they have no record. Can you please look into this? She has not even been able to get it filled?   lisinopril (PRINIVIL,ZESTRIL) 10 MG tablet    [facility] [address]    Was seen on 97/7/41 & has been trying with [facility] to get this ever since.      Please call pt when med has been sent

## 2022-10-04 NOTE — Telephone Encounter (Signed)
Patient aware.

## 2023-03-09 ENCOUNTER — Telehealth: Payer: Self-pay | Admitting: Internal Medicine

## 2023-03-09 ENCOUNTER — Encounter: Payer: Self-pay | Admitting: Internal Medicine

## 2023-03-09 ENCOUNTER — Ambulatory Visit: Payer: 59 | Admitting: Internal Medicine

## 2023-03-09 VITALS — BP 148/86 | HR 83 | Ht 62.0 in | Wt 187.6 lb

## 2023-03-09 DIAGNOSIS — J3089 Other allergic rhinitis: Secondary | ICD-10-CM

## 2023-03-09 DIAGNOSIS — E782 Mixed hyperlipidemia: Secondary | ICD-10-CM

## 2023-03-09 DIAGNOSIS — E669 Obesity, unspecified: Secondary | ICD-10-CM | POA: Diagnosis not present

## 2023-03-09 DIAGNOSIS — I10 Essential (primary) hypertension: Secondary | ICD-10-CM

## 2023-03-09 DIAGNOSIS — J309 Allergic rhinitis, unspecified: Secondary | ICD-10-CM | POA: Insufficient documentation

## 2023-03-09 MED ORDER — LISINOPRIL 5 MG PO TABS: 5.0000 mg | ORAL_TABLET | Freq: Every day | ORAL | 1 refills | Status: DC

## 2023-03-09 NOTE — Assessment & Plan Note (Signed)
Her intermittent right ear pain likely due to allergic rhinitis Ear exam benign Use Flonase

## 2023-03-09 NOTE — Telephone Encounter (Signed)
LVM

## 2023-03-09 NOTE — Assessment & Plan Note (Signed)
Was on Lipitor Check lipid profile 

## 2023-03-09 NOTE — Assessment & Plan Note (Addendum)
Advised to follow DASH diet and perform moderate exercise/walking at least 150 mins/week

## 2023-03-09 NOTE — Progress Notes (Signed)
Established Patient Office Visit  Subjective:  Patient ID: Gloria Huerta, female    DOB: 09/25/1966  Age: 57 y.o. MRN: [id number]  CC:  Chief Complaint  Patient presents with   Ear Pain    Right ear ache    HPI Gloria Huerta is a 57 y.o. female with past medical history of HTN, HLD and migraine who presents for f/u of her chronic medical conditions.  HTN: Her blood pressure was elevated today.  She denies any headache, dizziness, chest pain, dyspnea or palpitations.  She used to take lisinopril 10 mg QD in the past, but had hypotensive episodes due to it.  She reports intermittent right ear pain, but improves when she applies cottonball while going out.  She has chronic rhinitis, but denies any fever, chills, dyspnea or wheezing currently.  Denies any ear discharge.  Past Medical History:  Diagnosis Date   High cholesterol    Hypertension    Migraines     History reviewed. No pertinent surgical history.  History reviewed. No pertinent family history.  Social History   Socioeconomic History   Marital status: Single    Spouse name: Not on file   Number of children: Not on file   Years of education: Not on file   Highest education level: Not on file  Occupational History   Not on file  Tobacco Use   Smoking status: Never   Smokeless tobacco: Not on file  Substance and Sexual Activity   Alcohol use: No   Drug use: No   Sexual activity: Yes    Birth control/protection: None  Other Topics Concern   Not on file  Social History Narrative   Not on file   Social Determinants of Health   Financial Resource Strain: Not on file  Food Insecurity: Not on file  Transportation Needs: Not on file  Physical Activity: Not on file  Stress: Not on file  Social Connections: Not on file  Intimate Partner Violence: Not on file    Outpatient Medications Prior to Visit  Medication Sig Dispense Refill   lisinopril (PRINIVIL,ZESTRIL) 10 MG tablet Take 10 mg by mouth  daily. (Patient not taking: Reported on 09/07/2022)     No facility-administered medications prior to visit.    No Known Allergies  ROS Review of Systems  Constitutional:  Negative for chills and fever.  HENT:  Negative for congestion, sinus pressure, sinus pain and sore throat.   Eyes:  Negative for pain and discharge.  Respiratory:  Negative for cough and shortness of breath.   Cardiovascular:  Negative for chest pain and palpitations.  Gastrointestinal:  Negative for abdominal pain, constipation, diarrhea, nausea and vomiting.  Endocrine: Negative for polydipsia and polyuria.  Genitourinary:  Negative for dysuria and hematuria.  Musculoskeletal:  Negative for neck pain and neck stiffness.  Skin:  Negative for rash.  Neurological:  Negative for dizziness and weakness.  Psychiatric/Behavioral:  Negative for agitation and behavioral problems.       Objective:    Physical Exam Vitals reviewed.  Constitutional:      General: She is not in acute distress.    Appearance: She is obese. She is not diaphoretic.  HENT:     Head: Normocephalic and atraumatic.     Right Ear: Ear canal and external ear normal. There is no impacted cerumen.     Left Ear: Ear canal and external ear normal. There is no impacted cerumen.     Nose: Nose normal.  Mouth/Throat:     Mouth: Mucous membranes are moist.  Eyes:     General: No scleral icterus.    Extraocular Movements: Extraocular movements intact.  Cardiovascular:     Rate and Rhythm: Normal rate and regular rhythm.     Heart sounds: Normal heart sounds. No murmur heard. Pulmonary:     Breath sounds: Normal breath sounds. No wheezing or rales.  Musculoskeletal:     Cervical back: Neck supple. No tenderness.     Right lower leg: No edema.     Left lower leg: No edema.  Skin:    General: Skin is warm.     Findings: No rash.  Neurological:     General: No focal deficit present.     Mental Status: She is alert and oriented to person,  place, and time.  Psychiatric:        Mood and Affect: Mood normal.        Behavior: Behavior normal.     BP (!) 148/86 (BP Location: Left Arm)   Pulse 83   Ht 5\' 2"  (1.575 m)   Wt 187 lb 9.6 oz (85.1 kg)   SpO2 97%   BMI 34.31 kg/m  Wt Readings from Last 3 Encounters:  03/09/23 187 lb 9.6 oz (85.1 kg)  09/07/22 173 lb 3.2 oz (78.6 kg)  12/12/14 167 lb (75.8 kg)    Lab Results  Component Value Date   TSH 1.480 09/07/2022   Lab Results  Component Value Date   WBC 4.5 09/07/2022   HGB 14.6 09/07/2022   HCT 41.8 09/07/2022   MCV 88 09/07/2022   PLT 421 09/07/2022   Lab Results  Component Value Date   NA 140 09/07/2022   K 4.6 09/07/2022   CO2 24 09/07/2022   GLUCOSE 96 09/07/2022   BUN 7 09/07/2022   CREATININE 1.01 (H) 09/07/2022   BILITOT 0.8 09/07/2022   ALKPHOS 144 (H) 09/07/2022   AST 20 09/07/2022   ALT 13 09/07/2022   PROT 7.9 09/07/2022   ALBUMIN 4.7 09/07/2022   CALCIUM 10.3 (H) 09/07/2022   EGFR 65 09/07/2022   Lab Results  Component Value Date   CHOL 213 (H) 09/07/2022   Lab Results  Component Value Date   HDL 51 09/07/2022   Lab Results  Component Value Date   LDLCALC 152 (H) 09/07/2022   Lab Results  Component Value Date   TRIG 57 09/07/2022   Lab Results  Component Value Date   CHOLHDL 4.2 09/07/2022   Lab Results  Component Value Date   HGBA1C 5.4 09/07/2022      Assessment & Plan:   Problem List Items Addressed This Visit       Cardiovascular and Mediastinum   Essential hypertension - Primary    BP Readings from Last 1 Encounters:  03/09/23 (!) 148/86  Uncontrolled with diet modification Was on Lisinopril in the past, restarted 5 mg QD Advised DASH diet and moderate exercise/walking, at least 150 mins/week       Relevant Medications   lisinopril (ZESTRIL) 5 MG tablet   Other Relevant Orders   Basic Metabolic Panel (BMET)     Respiratory   Allergic rhinitis    Her intermittent right ear pain likely due to  allergic rhinitis Ear exam benign Use Flonase        Other   Mixed hyperlipidemia    Was on Lipitor Check lipid profile      Relevant Medications   lisinopril (ZESTRIL) 5 MG  tablet   Other Relevant Orders   Lipid Profile   Obesity (BMI 30-39.9)    Advised to follow DASH diet and perform moderate exercise/walking at least 150 mins/week       Meds ordered this encounter  Medications   lisinopril (ZESTRIL) 5 MG tablet    Sig: Take 1 tablet (5 mg total) by mouth daily.    Dispense:  90 tablet    Refill:  1    Follow-up: Return in about 2 months (around 05/09/2023) for HTN.    Anabel Halon, MD

## 2023-03-09 NOTE — Patient Instructions (Signed)
Please start taking Lisinopril as prescribed.  Please get blood tests done after 2 weeks.  Please continue to follow DASH diet and perform moderate exercise/walking at least 150 mins/week.

## 2023-03-09 NOTE — Telephone Encounter (Signed)
Patient called could not remember which vitamin should she begin taking. Call patient at [phone]

## 2023-03-09 NOTE — Assessment & Plan Note (Addendum)
BP Readings from Last 1 Encounters:  03/09/23 (!) 148/86   Uncontrolled with diet modification Was on Lisinopril in the past, restarted 5 mg QD Advised DASH diet and moderate exercise/walking, at least 150 mins/week

## 2023-03-12 NOTE — Telephone Encounter (Signed)
Patient advised.

## 2023-03-28 LAB — BASIC METABOLIC PANEL
BUN/Creatinine Ratio: 7 — ABNORMAL LOW (ref 9–23)
BUN: 7 mg/dL (ref 6–24)
CO2: 24 mmol/L (ref 20–29)
Calcium: 9.8 mg/dL (ref 8.7–10.2)
Chloride: 102 mmol/L (ref 96–106)
Creatinine, Ser: 0.97 mg/dL (ref 0.57–1.00)
Glucose: 89 mg/dL (ref 70–99)
Potassium: 4.2 mmol/L (ref 3.5–5.2)
Sodium: 139 mmol/L (ref 134–144)
eGFR: 68 mL/min/{1.73_m2} (ref >59)

## 2023-03-28 LAB — LIPID PANEL
Chol/HDL Ratio: 4.5 ratio — ABNORMAL HIGH (ref 0.0–4.4)
Cholesterol, Total: 232 mg/dL — ABNORMAL HIGH (ref 100–199)
HDL: 51 mg/dL (ref >39)
LDL Chol Calc (NIH): 168 mg/dL — ABNORMAL HIGH (ref 0–99)
Triglycerides: 76 mg/dL (ref 0–149)
VLDL Cholesterol Cal: 13 mg/dL (ref 5–40)

## 2023-04-12 ENCOUNTER — Telehealth: Payer: Self-pay | Admitting: Internal Medicine

## 2023-04-12 NOTE — Telephone Encounter (Deleted)
[id number]

## 2023-04-12 NOTE — Telephone Encounter (Signed)
Tried to call patient back , kept ringing

## 2023-04-12 NOTE — Telephone Encounter (Addendum)
Pt called and stated, she got Vitamin D3 1000mg  and she just wanted to confirm its okay to take that. She said Allena Katz suggested 500mg  but she cannot find that dose otc. Please follow up in regards.

## 2023-04-12 NOTE — Telephone Encounter (Deleted)
[id number]

## 2023-04-12 NOTE — Telephone Encounter (Signed)
Pt returned phone call.  

## 2023-04-12 NOTE — Telephone Encounter (Deleted)
k

## 2023-04-12 NOTE — Telephone Encounter (Signed)
Left message for patient

## 2023-05-11 ENCOUNTER — Encounter: Payer: Self-pay | Admitting: Internal Medicine

## 2023-05-11 ENCOUNTER — Ambulatory Visit (INDEPENDENT_AMBULATORY_CARE_PROVIDER_SITE_OTHER): Payer: 59 | Admitting: Internal Medicine

## 2023-05-11 VITALS — BP 137/83 | HR 85 | Ht 62.0 in | Wt 183.0 lb

## 2023-05-11 DIAGNOSIS — I10 Essential (primary) hypertension: Secondary | ICD-10-CM

## 2023-05-11 DIAGNOSIS — E559 Vitamin D deficiency, unspecified: Secondary | ICD-10-CM

## 2023-05-11 DIAGNOSIS — L819 Disorder of pigmentation, unspecified: Secondary | ICD-10-CM | POA: Diagnosis not present

## 2023-05-11 DIAGNOSIS — R739 Hyperglycemia, unspecified: Secondary | ICD-10-CM

## 2023-05-11 DIAGNOSIS — E782 Mixed hyperlipidemia: Secondary | ICD-10-CM | POA: Diagnosis not present

## 2023-05-11 MED ORDER — FLUOCINOLONE ACETONIDE 0.01 % EX CREA: TOPICAL_CREAM | Freq: Two times a day (BID) | CUTANEOUS | 0 refills | Status: DC

## 2023-05-11 NOTE — Assessment & Plan Note (Signed)
BP Readings from Last 1 Encounters:  05/11/23 137/83   Well-controlled with lisinopril 5 mg QD Was on Lisinopril in the past, restarted 5 mg QD Advised DASH diet and moderate exercise/walking, at least 150 mins/week

## 2023-05-11 NOTE — Assessment & Plan Note (Signed)
Was on Lipitor Checked lipid profile Discussed about need for statin, but she prefers to manage it with diet for now If persistently elevated LDL, will start statin

## 2023-05-11 NOTE — Assessment & Plan Note (Signed)
Similar to melasma Trial of fluocinolone cream

## 2023-05-11 NOTE — Patient Instructions (Signed)
Please continue to take medications as prescribed.  Please continue to follow DASH diet and perform moderate exercise/walking at least 150 mins/week.  Please get fasting blood tests done before the next visit. 

## 2023-05-11 NOTE — Progress Notes (Signed)
Established Patient Office Visit  Subjective:  Patient ID: Gloria Huerta, female    DOB: 07-16-1966  Age: 57 y.o. MRN: [id number]  CC:  Chief Complaint  Patient presents with   Hypertension    Pt follow up for htn.    HPI Gloria Huerta is a 57 y.o. female with past medical history of HTN, HLD and migraine who presents for f/u of her chronic medical conditions.  HTN: Her blood pressure was wnl today.  She is tolerating lisinopril 5 mg QD.  She denies any headache, dizziness, chest pain, dyspnea or palpitations.  She used to take lisinopril 10 mg QD in the past, but had hypotensive episodes due to it.  HLD: She had elevated LDL at 168.  She prefers to manage it with diet and denies to take statin for now.  She reports darkening of the skin on the cheek area, which has been worse for the last 3 months.  Denies any itching currently.  Denies any recent change in soap, shampoo or lotion.  Past Medical History:  Diagnosis Date   High cholesterol    Hypertension    Migraines     Past Surgical History:  Procedure Laterality Date   ABDOMINAL HYSTERECTOMY  2001    History reviewed. No pertinent family history.  Social History   Socioeconomic History   Marital status: Single    Spouse name: Not on file   Number of children: Not on file   Years of education: Not on file   Highest education level: Not on file  Occupational History   Not on file  Tobacco Use   Smoking status: Never   Smokeless tobacco: Not on file  Substance and Sexual Activity   Alcohol use: No   Drug use: No   Sexual activity: Yes    Birth control/protection: None  Other Topics Concern   Not on file  Social History Narrative   Not on file   Social Determinants of Health   Financial Resource Strain: Not on file  Food Insecurity: Not on file  Transportation Needs: Not on file  Physical Activity: Not on file  Stress: Not on file  Social Connections: Not on file  Intimate Partner  Violence: Not on file    Outpatient Medications Prior to Visit  Medication Sig Dispense Refill   lisinopril (ZESTRIL) 5 MG tablet Take 1 tablet (5 mg total) by mouth daily. 90 tablet 1   No facility-administered medications prior to visit.    No Known Allergies  ROS Review of Systems  Constitutional:  Negative for chills and fever.  HENT:  Negative for congestion, sinus pressure, sinus pain and sore throat.   Eyes:  Negative for pain and discharge.  Respiratory:  Negative for cough and shortness of breath.   Cardiovascular:  Negative for chest pain and palpitations.  Gastrointestinal:  Negative for abdominal pain, constipation, diarrhea, nausea and vomiting.  Endocrine: Negative for polydipsia and polyuria.  Genitourinary:  Negative for dysuria and hematuria.  Musculoskeletal:  Negative for neck pain and neck stiffness.  Skin:  Negative for rash.  Neurological:  Negative for dizziness and weakness.  Psychiatric/Behavioral:  Negative for agitation and behavioral problems.       Objective:    Physical Exam Vitals reviewed.  Constitutional:      General: She is not in acute distress.    Appearance: She is obese. She is not diaphoretic.  HENT:     Head: Normocephalic and atraumatic.  Right Ear: Ear canal and external ear normal. There is no impacted cerumen.     Left Ear: Ear canal and external ear normal. There is no impacted cerumen.     Nose: Nose normal.     Mouth/Throat:     Mouth: Mucous membranes are moist.  Eyes:     General: No scleral icterus.    Extraocular Movements: Extraocular movements intact.  Cardiovascular:     Rate and Rhythm: Normal rate and regular rhythm.     Heart sounds: Normal heart sounds. No murmur heard. Pulmonary:     Breath sounds: Normal breath sounds. No wheezing or rales.  Musculoskeletal:     Cervical back: Neck supple. No tenderness.     Right lower leg: No edema.     Left lower leg: No edema.  Skin:    General: Skin is warm.      Findings: No rash.     Comments: Hyperpigmented spots over facial area  Neurological:     General: No focal deficit present.     Mental Status: She is alert and oriented to person, place, and time.  Psychiatric:        Mood and Affect: Mood normal.        Behavior: Behavior normal.     BP 137/83   Pulse 85   Ht 5\' 2"  (1.575 m)   Wt 183 lb 0.6 oz (83 kg)   SpO2 97%   BMI 33.48 kg/m  Wt Readings from Last 3 Encounters:  05/11/23 183 lb 0.6 oz (83 kg)  03/09/23 187 lb 9.6 oz (85.1 kg)  09/07/22 173 lb 3.2 oz (78.6 kg)    Lab Results  Component Value Date   TSH 1.480 09/07/2022   Lab Results  Component Value Date   WBC 4.5 09/07/2022   HGB 14.6 09/07/2022   HCT 41.8 09/07/2022   MCV 88 09/07/2022   PLT 421 09/07/2022   Lab Results  Component Value Date   NA 139 03/27/2023   K 4.2 03/27/2023   CO2 24 03/27/2023   GLUCOSE 89 03/27/2023   BUN 7 03/27/2023   CREATININE 0.97 03/27/2023   BILITOT 0.8 09/07/2022   ALKPHOS 144 (H) 09/07/2022   AST 20 09/07/2022   ALT 13 09/07/2022   PROT 7.9 09/07/2022   ALBUMIN 4.7 09/07/2022   CALCIUM 9.8 03/27/2023   EGFR 68 03/27/2023   Lab Results  Component Value Date   CHOL 232 (H) 03/27/2023   Lab Results  Component Value Date   HDL 51 03/27/2023   Lab Results  Component Value Date   LDLCALC 168 (H) 03/27/2023   Lab Results  Component Value Date   TRIG 76 03/27/2023   Lab Results  Component Value Date   CHOLHDL 4.5 (H) 03/27/2023   Lab Results  Component Value Date   HGBA1C 5.4 09/07/2022      Assessment & Plan:   Problem List Items Addressed This Visit       Cardiovascular and Mediastinum   Essential hypertension - Primary    BP Readings from Last 1 Encounters:  05/11/23 137/83  Well-controlled with lisinopril 5 mg QD Was on Lisinopril in the past, restarted 5 mg QD Advised DASH diet and moderate exercise/walking, at least 150 mins/week       Relevant Orders   CMP14+EGFR   TSH   CBC  with Differential/Platelet     Musculoskeletal and Integument   Hyperpigmentation of skin    Similar to melasma Trial of  fluocinolone cream      Relevant Medications   fluocinolone 0.01 % cream     Other   Mixed hyperlipidemia    Was on Lipitor Checked lipid profile Discussed about need for statin, but she prefers to manage it with diet for now If persistently elevated LDL, will start statin      Relevant Orders   Lipid Profile   Other Visit Diagnoses     Vitamin D deficiency       Relevant Orders   VITAMIN D 25 Hydroxy (Vit-D Deficiency, Fractures)   Hyperglycemia       Relevant Orders   Hemoglobin A1c       Meds ordered this encounter  Medications   fluocinolone 0.01 % cream    Sig: Apply topically 2 (two) times daily.    Dispense:  30 g    Refill:  0    Follow-up: Return in about 4 months (around 09/11/2023) for Annual physical.    Anabel Halon, MD

## 2023-08-07 ENCOUNTER — Other Ambulatory Visit: Payer: Self-pay | Admitting: Internal Medicine

## 2023-08-07 DIAGNOSIS — I10 Essential (primary) hypertension: Secondary | ICD-10-CM

## 2023-08-10 ENCOUNTER — Other Ambulatory Visit: Payer: Self-pay | Admitting: Internal Medicine

## 2023-08-10 DIAGNOSIS — Z1212 Encounter for screening for malignant neoplasm of rectum: Secondary | ICD-10-CM

## 2023-08-10 DIAGNOSIS — Z1211 Encounter for screening for malignant neoplasm of colon: Secondary | ICD-10-CM

## 2023-08-13 ENCOUNTER — Telehealth: Payer: Self-pay | Admitting: Internal Medicine

## 2023-08-13 NOTE — Telephone Encounter (Signed)
Blood work was ordered back in July, patient advised to come have her labs done

## 2023-08-13 NOTE — Telephone Encounter (Signed)
Patient calling wanting to know if her labs can be ordered early so she can have her labs done before her appt. Please advise

## 2023-08-23 ENCOUNTER — Other Ambulatory Visit: Payer: Self-pay | Admitting: Internal Medicine

## 2023-08-23 DIAGNOSIS — L819 Disorder of pigmentation, unspecified: Secondary | ICD-10-CM

## 2023-09-05 LAB — CMP14+EGFR
ALT: 17 [IU]/L (ref 0–32)
AST: 22 [IU]/L (ref 0–40)
Albumin: 4.6 g/dL (ref 3.8–4.9)
Alkaline Phosphatase: 132 [IU]/L — ABNORMAL HIGH (ref 44–121)
BUN/Creatinine Ratio: 8 — ABNORMAL LOW (ref 9–23)
BUN: 8 mg/dL (ref 6–24)
Bilirubin Total: 0.7 mg/dL (ref 0.0–1.2)
CO2: 21 mmol/L (ref 20–29)
Calcium: 9.8 mg/dL (ref 8.7–10.2)
Chloride: 103 mmol/L (ref 96–106)
Creatinine, Ser: 1.02 mg/dL — ABNORMAL HIGH (ref 0.57–1.00)
Globulin, Total: 3.2 g/dL (ref 1.5–4.5)
Glucose: 91 mg/dL (ref 70–99)
Potassium: 4 mmol/L (ref 3.5–5.2)
Sodium: 141 mmol/L (ref 134–144)
Total Protein: 7.8 g/dL (ref 6.0–8.5)
eGFR: 64 mL/min/{1.73_m2} (ref >59)

## 2023-09-05 LAB — LIPID PANEL
Chol/HDL Ratio: 4.2 {ratio} (ref 0.0–4.4)
Cholesterol, Total: 239 mg/dL — ABNORMAL HIGH (ref 100–199)
HDL: 57 mg/dL (ref >39)
LDL Chol Calc (NIH): 168 mg/dL — ABNORMAL HIGH (ref 0–99)
Triglycerides: 79 mg/dL (ref 0–149)
VLDL Cholesterol Cal: 14 mg/dL (ref 5–40)

## 2023-09-05 LAB — CBC WITH DIFFERENTIAL/PLATELET
Basophils Absolute: 0 10*3/uL (ref 0.0–0.2)
Basos: 1 %
EOS (ABSOLUTE): 0.1 10*3/uL (ref 0.0–0.4)
Eos: 3 %
Hematocrit: 42.4 % (ref 34.0–46.6)
Hemoglobin: 14.1 g/dL (ref 11.1–15.9)
Immature Grans (Abs): 0 10*3/uL (ref 0.0–0.1)
Immature Granulocytes: 0 %
Lymphocytes Absolute: 1.8 10*3/uL (ref 0.7–3.1)
Lymphs: 41 %
MCH: 30.1 pg (ref 26.6–33.0)
MCHC: 33.3 g/dL (ref 31.5–35.7)
MCV: 91 fL (ref 79–97)
Monocytes Absolute: 0.5 10*3/uL (ref 0.1–0.9)
Monocytes: 10 %
Neutrophils Absolute: 2 10*3/uL (ref 1.4–7.0)
Neutrophils: 45 %
Platelets: 368 10*3/uL (ref 150–450)
RBC: 4.68 x10E6/uL (ref 3.77–5.28)
RDW: 13.3 % (ref 11.7–15.4)
WBC: 4.5 10*3/uL (ref 3.4–10.8)

## 2023-09-05 LAB — TSH: TSH: 2 u[IU]/mL (ref 0.450–4.500)

## 2023-09-05 LAB — VITAMIN D 25 HYDROXY (VIT D DEFICIENCY, FRACTURES): Vit D, 25-Hydroxy: 7.7 ng/mL — ABNORMAL LOW (ref 30.0–100.0)

## 2023-09-05 LAB — HEMOGLOBIN A1C
Est. average glucose Bld gHb Est-mCnc: 111 mg/dL
Hgb A1c MFr Bld: 5.5 % (ref 4.8–5.6)

## 2023-09-12 ENCOUNTER — Encounter: Payer: Self-pay | Admitting: Internal Medicine

## 2023-09-12 ENCOUNTER — Other Ambulatory Visit: Payer: Self-pay

## 2023-09-12 ENCOUNTER — Ambulatory Visit (INDEPENDENT_AMBULATORY_CARE_PROVIDER_SITE_OTHER): Payer: 59 | Admitting: Internal Medicine

## 2023-09-12 VITALS — BP 132/84 | HR 87 | Ht 62.0 in | Wt 181.6 lb

## 2023-09-12 DIAGNOSIS — I10 Essential (primary) hypertension: Secondary | ICD-10-CM | POA: Diagnosis not present

## 2023-09-12 DIAGNOSIS — Z0001 Encounter for general adult medical examination with abnormal findings: Secondary | ICD-10-CM | POA: Diagnosis not present

## 2023-09-12 DIAGNOSIS — Z23 Encounter for immunization: Secondary | ICD-10-CM | POA: Diagnosis not present

## 2023-09-12 DIAGNOSIS — E559 Vitamin D deficiency, unspecified: Secondary | ICD-10-CM | POA: Diagnosis not present

## 2023-09-12 DIAGNOSIS — E782 Mixed hyperlipidemia: Secondary | ICD-10-CM

## 2023-09-12 MED ORDER — VITAMIN D (ERGOCALCIFEROL) 1.25 MG (50000 UNIT) PO CAPS: 50000.0000 [IU] | ORAL_CAPSULE | ORAL | 1 refills | Status: DC

## 2023-09-12 MED ORDER — ATORVASTATIN CALCIUM 20 MG PO TABS: 20.0000 mg | ORAL_TABLET | Freq: Every day | ORAL | 1 refills | Status: DC

## 2023-09-12 NOTE — Patient Instructions (Addendum)
Please get Cologuard done as discussed.  Please start taking Atorvastatin 20 mg once daily as prescribed.  Please start taking Vitamin D 50,000 IU once weekly.  Please continue to take medications as prescribed.  Please continue to follow low salt diet and perform moderate exercise/walking at least 150 mins/week.  Please get fasting blood tests done before the next visit.

## 2023-09-12 NOTE — Assessment & Plan Note (Signed)

## 2023-09-12 NOTE — Assessment & Plan Note (Signed)
Last vitamin D Lab Results  Component Value Date   VD25OH 7.7 (L) 09/04/2023   Started vitamin D 50,000 IU QW

## 2023-09-12 NOTE — Progress Notes (Signed)
Established Patient Office Visit  Subjective:  Patient ID: Gloria Huerta, female    DOB: [birth year]  Age: 57 y.o. MRN: [id number]  CC:  Chief Complaint  Patient presents with   Annual Exam    HPI Gloria Huerta is a 57 y.o. female with past medical history of HTN, HLD and migraine who presents for annual physical.  HTN: Her blood pressure was wnl today.  She is tolerating lisinopril 5 mg QD.  She denies any headache, dizziness, chest pain, dyspnea or palpitations.   HLD: She had elevated LDL at 168.  She has been made changes in her diet, but still has elevated LDL.  She agrees to take statin for now.   She had darkening of the skin on the cheek area.  She has responded well to fluocinolone cream.  Denies any itching currently.  Denies any recent change in soap, shampoo or lotion.      Past Medical History:  Diagnosis Date   High cholesterol    Hypertension    Migraines     Past Surgical History:  Procedure Laterality Date   ABDOMINAL HYSTERECTOMY  2001    History reviewed. No pertinent family history.  Social History   Socioeconomic History   Marital status: Single    Spouse name: Not on file   Number of children: Not on file   Years of education: Not on file   Highest education level: Not on file  Occupational History   Not on file  Tobacco Use   Smoking status: Never   Smokeless tobacco: Not on file  Substance and Sexual Activity   Alcohol use: No   Drug use: No   Sexual activity: Yes    Birth control/protection: None  Other Topics Concern   Not on file  Social History Narrative   Not on file   Social Determinants of Health   Financial Resource Strain: Not on file  Food Insecurity: Not on file  Transportation Needs: Not on file  Physical Activity: Not on file  Stress: Not on file  Social Connections: Not on file  Intimate Partner Violence: Not on file    Outpatient Medications Prior to Visit  Medication Sig Dispense Refill    fluocinolone 0.01 % cream APPLY TOPICALLY TO THE AFFECTED AREA TWICE DAILY 30 g 0   lisinopril (ZESTRIL) 5 MG tablet TAKE 1 TABLET(5 MG) BY MOUTH DAILY 90 tablet 1   No facility-administered medications prior to visit.    No Known Allergies  ROS Review of Systems  Constitutional:  Negative for chills and fever.  HENT:  Negative for congestion, sinus pressure, sinus pain and sore throat.   Eyes:  Negative for pain and discharge.  Respiratory:  Negative for cough and shortness of breath.   Cardiovascular:  Negative for chest pain and palpitations.  Gastrointestinal:  Negative for abdominal pain, constipation, diarrhea, nausea and vomiting.  Endocrine: Negative for polydipsia and polyuria.  Genitourinary:  Negative for dysuria and hematuria.  Musculoskeletal:  Negative for neck pain and neck stiffness.  Skin:  Negative for rash.  Neurological:  Negative for dizziness and weakness.  Psychiatric/Behavioral:  Negative for agitation and behavioral problems.       Objective:    Physical Exam Vitals reviewed.  Constitutional:      General: She is not in acute distress.    Appearance: She is obese. She is not diaphoretic.  HENT:     Head: Normocephalic and atraumatic.     Right Ear: There  is no impacted cerumen.     Left Ear: There is no impacted cerumen.     Nose: Nose normal.     Mouth/Throat:     Mouth: Mucous membranes are moist.  Eyes:     General: No scleral icterus.    Extraocular Movements: Extraocular movements intact.  Cardiovascular:     Rate and Rhythm: Normal rate and regular rhythm.     Heart sounds: Normal heart sounds. No murmur heard. Pulmonary:     Breath sounds: Normal breath sounds. No wheezing or rales.  Musculoskeletal:     Cervical back: Neck supple. No tenderness.     Right lower leg: No edema.     Left lower leg: No edema.  Skin:    General: Skin is warm.     Findings: No rash.     Comments: Hyperpigmented spots over facial area -now improved   Neurological:     General: No focal deficit present.     Mental Status: She is alert and oriented to person, place, and time.  Psychiatric:        Mood and Affect: Mood normal.        Behavior: Behavior normal.     BP 132/84 (BP Location: Left Arm, Patient Position: Sitting, Cuff Size: Large)   Pulse 87   Ht 5\' 2"  (1.575 m)   Wt 181 lb 9.6 oz (82.4 kg)   SpO2 97%   BMI 33.22 kg/m  Wt Readings from Last 3 Encounters:  09/12/23 181 lb 9.6 oz (82.4 kg)  05/11/23 183 lb 0.6 oz (83 kg)  03/09/23 187 lb 9.6 oz (85.1 kg)    Lab Results  Component Value Date   TSH 2.000 09/04/2023   Lab Results  Component Value Date   WBC 4.5 09/04/2023   HGB 14.1 09/04/2023   HCT 42.4 09/04/2023   MCV 91 09/04/2023   PLT 368 09/04/2023   Lab Results  Component Value Date   NA 141 09/04/2023   K 4.0 09/04/2023   CO2 21 09/04/2023   GLUCOSE 91 09/04/2023   BUN 8 09/04/2023   CREATININE 1.02 (H) 09/04/2023   BILITOT 0.7 09/04/2023   ALKPHOS 132 (H) 09/04/2023   AST 22 09/04/2023   ALT 17 09/04/2023   PROT 7.8 09/04/2023   ALBUMIN 4.6 09/04/2023   CALCIUM 9.8 09/04/2023   EGFR 64 09/04/2023   Lab Results  Component Value Date   CHOL 239 (H) 09/04/2023   Lab Results  Component Value Date   HDL 57 09/04/2023   Lab Results  Component Value Date   LDLCALC 168 (H) 09/04/2023   Lab Results  Component Value Date   TRIG 79 09/04/2023   Lab Results  Component Value Date   CHOLHDL 4.2 09/04/2023   Lab Results  Component Value Date   HGBA1C 5.5 09/04/2023      Assessment & Plan:   Problem List Items Addressed This Visit       Cardiovascular and Mediastinum   Essential hypertension    BP Readings from Last 1 Encounters:  09/12/23 132/84   Well-controlled with lisinopril 5 mg QD Advised DASH diet and moderate exercise/walking, at least 150 mins/week      Relevant Medications   atorvastatin (LIPITOR) 20 MG tablet   Other Relevant Orders   CMP14+EGFR     Other    Encounter for general adult medical examination with abnormal findings - Primary    Physical exam as documented. Counseling done  re healthy lifestyle  involving commitment to 150 minutes exercise per week, heart healthy diet, and attaining healthy weight.The importance of adequate sleep also discussed. Changes in health habits are decided on by the patient with goals and time frames  set for achieving them. Immunization and cancer screening needs are specifically addressed at this visit.      Mixed hyperlipidemia    Checked lipid profile Due to persistently elevated LDL, will start statin -atorvastatin 20 mg QD      Relevant Medications   atorvastatin (LIPITOR) 20 MG tablet   Other Relevant Orders   Lipid Profile   Vitamin D deficiency    Last vitamin D Lab Results  Component Value Date   VD25OH 7.7 (L) 09/04/2023   Started vitamin D 50,000 IU QW      Relevant Medications   Vitamin D, Ergocalciferol, (DRISDOL) 1.25 MG (50000 UNIT) CAPS capsule   Other Visit Diagnoses     Encounter for immunization       Relevant Orders   Flu vaccine trivalent PF, 6mos and older(Flulaval,Afluria,Fluarix,Fluzone) (Completed)       Meds ordered this encounter  Medications   atorvastatin (LIPITOR) 20 MG tablet    Sig: Take 1 tablet (20 mg total) by mouth daily.    Dispense:  90 tablet    Refill:  1   Vitamin D, Ergocalciferol, (DRISDOL) 1.25 MG (50000 UNIT) CAPS capsule    Sig: Take 1 capsule (50,000 Units total) by mouth every 7 (seven) days.    Dispense:  12 capsule    Refill:  1    Follow-up: Return in about 6 months (around 03/11/2024) for HTN and HLD.    Anabel Halon, MD

## 2023-09-12 NOTE — Assessment & Plan Note (Addendum)
Checked lipid profile Due to persistently elevated LDL, will start statin -atorvastatin 20 mg QD

## 2023-09-12 NOTE — Assessment & Plan Note (Addendum)
BP Readings from Last 1 Encounters:  09/12/23 132/84   Well-controlled with lisinopril 5 mg QD Advised DASH diet and moderate exercise/walking, at least 150 mins/week

## 2023-09-26 ENCOUNTER — Other Ambulatory Visit: Payer: Self-pay | Admitting: Internal Medicine

## 2023-09-26 DIAGNOSIS — L819 Disorder of pigmentation, unspecified: Secondary | ICD-10-CM

## 2023-09-26 MED ORDER — FLUOCINOLONE ACETONIDE 0.01 % EX CREA: TOPICAL_CREAM | Freq: Two times a day (BID) | CUTANEOUS | 1 refills | Status: DC

## 2023-09-26 NOTE — Telephone Encounter (Signed)
Patient is needing [facility] she needs a new prescription for her cream for her face  [facility] can not filled pt cream there  due to her insurance

## 2023-09-26 NOTE — Telephone Encounter (Signed)
Copied from CRM [id number]. Topic: Clinical - Medication Refill >> Sep 26, 2023  8:02 AM Deaijah H wrote: Most Recent Primary Care Visit:  Provider: Anabel Halon  Department: [facility]  Visit Type: PHYSICAL  Date: 09/12/2023  Medication: fluocinolone 0.01 % cream  Has the patient contacted their pharmacy? Yes (Agent: If no, request that the patient contact the pharmacy for the refill. If patient does not wish to contact the pharmacy document the reason why and proceed with request.) (Agent: If yes, when and what did the pharmacy advise?)  Is this the correct pharmacy for this prescription?  If no, delete pharmacy and type the correct one.  This is the patient's preferred pharmacy:  [id number] [place], [place] - [address] AT [facility] [address] [place] [address] Phone: [phone] Fax: [phone]   Has the prescription been filled recently? Yes  Is the patient out of the medication?   Has the patient been seen for an appointment in the last year OR does the patient have an upcoming appointment?   Can we respond through MyChart?   Agent: Please be advised that Rx refills may take up to 3 business days. We ask that you follow-up with your pharmacy.

## 2023-10-03 ENCOUNTER — Other Ambulatory Visit: Payer: Self-pay | Admitting: Internal Medicine

## 2023-10-03 ENCOUNTER — Telehealth: Payer: Self-pay | Admitting: Internal Medicine

## 2023-10-03 DIAGNOSIS — L819 Disorder of pigmentation, unspecified: Secondary | ICD-10-CM

## 2023-10-03 MED ORDER — FLUOCINOLONE ACETONIDE 0.01 % EX CREA: TOPICAL_CREAM | Freq: Two times a day (BID) | CUTANEOUS | 1 refills | Status: DC

## 2023-10-03 NOTE — Telephone Encounter (Signed)
Patient advised.

## 2023-10-03 NOTE — Telephone Encounter (Signed)
Patient came by the office need refill on cream fluocinolone 0.01 % cream  [facility] 

## 2023-10-29 ENCOUNTER — Telehealth: Payer: Self-pay | Admitting: Internal Medicine

## 2023-10-29 NOTE — Telephone Encounter (Unsigned)
Copied from CRM [id number]. Topic: Clinical - Medication Question >> Oct 29, 2023 11:21 AM Gloria Huerta wrote: Reason for CRM: The patient is requesting a call back from [place] at [phone] she cannot get her Vitamin D, Ergocalciferol, (DRISDOL) 1.25 MG (50000 UNIT) CAPS capsule refilled until 12/02/23. However, she only has 5 left.

## 2023-10-30 NOTE — Telephone Encounter (Signed)
Left message to return call 

## 2023-11-13 ENCOUNTER — Other Ambulatory Visit: Payer: Self-pay

## 2023-11-13 ENCOUNTER — Other Ambulatory Visit: Payer: Self-pay | Admitting: Internal Medicine

## 2023-11-13 DIAGNOSIS — I10 Essential (primary) hypertension: Secondary | ICD-10-CM

## 2023-11-13 DIAGNOSIS — L819 Disorder of pigmentation, unspecified: Secondary | ICD-10-CM

## 2023-11-13 DIAGNOSIS — E559 Vitamin D deficiency, unspecified: Secondary | ICD-10-CM

## 2023-11-13 DIAGNOSIS — E782 Mixed hyperlipidemia: Secondary | ICD-10-CM

## 2023-11-13 MED ORDER — VITAMIN D (ERGOCALCIFEROL) 1.25 MG (50000 UNIT) PO CAPS: 50000.0000 [IU] | ORAL_CAPSULE | ORAL | 1 refills | Status: DC

## 2023-11-13 MED ORDER — LISINOPRIL 5 MG PO TABS: 5.0000 mg | ORAL_TABLET | Freq: Every day | ORAL | 1 refills | Status: DC

## 2023-11-13 MED ORDER — ATORVASTATIN CALCIUM 20 MG PO TABS: 20.0000 mg | ORAL_TABLET | Freq: Every day | ORAL | 1 refills | Status: DC

## 2023-11-13 NOTE — Telephone Encounter (Signed)
 Copied from CRM [id number]. Topic: Clinical - Medication Refill >> Nov 13, 2023 10:57 AM Powell HERO wrote: Most Recent Primary Care Visit:  Provider: TOBIE SUZZANE POUR  Department: [facility]  Visit Type: PHYSICAL  Date: 09/12/2023  Medication: atorvastatin  (LIPITOR) 20 MG tablet, lisinopril  (ZESTRIL ) 5 MG tablet,   Has the patient contacted their pharmacy?  (Agent: If no, request that the patient contact the pharmacy for the refill. If patient does not wish to contact the pharmacy document the reason why and proceed with request.) (Agent: If yes, when and what did the pharmacy advise?)  Is this the correct pharmacy for this prescription?  If no, delete pharmacy and type the correct one.  This is the patient's preferred pharmacy:  [facility] [phone] - [place], [place] - [address] AT [facility] [address] [place] [address] Phone: [phone] Fax: [phone]  [id number] - [place], [place] - [address] AT [facility] [address] [place] [address] Phone: [phone] Fax: [phone]   Has the prescription been filled recently?   Is the patient out of the medication?   Has the patient been seen for an appointment in the last year OR does the patient have an upcoming appointment?   Can we respond through MyChart?   Agent: Please be advised that Rx refills may take up to 3 business days. We ask that you follow-up with your pharmacy.

## 2024-02-20 ENCOUNTER — Other Ambulatory Visit: Payer: Self-pay

## 2024-02-20 ENCOUNTER — Telehealth: Payer: Self-pay

## 2024-02-20 ENCOUNTER — Other Ambulatory Visit: Payer: Self-pay | Admitting: Internal Medicine

## 2024-02-20 DIAGNOSIS — I10 Essential (primary) hypertension: Secondary | ICD-10-CM

## 2024-02-20 DIAGNOSIS — E559 Vitamin D deficiency, unspecified: Secondary | ICD-10-CM

## 2024-02-20 DIAGNOSIS — E782 Mixed hyperlipidemia: Secondary | ICD-10-CM

## 2024-02-20 MED ORDER — ATORVASTATIN CALCIUM 20 MG PO TABS: 20.0000 mg | ORAL_TABLET | Freq: Every day | ORAL | 1 refills | Status: DC

## 2024-02-20 MED ORDER — LISINOPRIL 5 MG PO TABS: 5.0000 mg | ORAL_TABLET | Freq: Every day | ORAL | 1 refills | Status: DC

## 2024-02-20 MED ORDER — VITAMIN D (ERGOCALCIFEROL) 1.25 MG (50000 UNIT) PO CAPS: 50000.0000 [IU] | ORAL_CAPSULE | ORAL | 1 refills | Status: DC

## 2024-02-20 NOTE — Telephone Encounter (Signed)
 Prescription Request  02/20/2024  LOV: Visit date not found  What is the name of the medication or equipment? Vitamin D, Ergocalciferol, (DRISDOL) 1.25 MG (50000 UNIT) CAPS capsule   Have you contacted your pharmacy to request a refill? Yes   Which pharmacy would you like this sent to?   [facility]   Patient notified that their request is being sent to the clinical staff for review and that they should receive a response within 2 business days.   Please advise at Mobile [phone] (mobile)

## 2024-02-23 LAB — CMP14+EGFR
ALT: 17 IU/L (ref 0–32)
AST: 23 IU/L (ref 0–40)
Albumin: 4.4 g/dL (ref 3.8–4.9)
Alkaline Phosphatase: 142 IU/L — ABNORMAL HIGH (ref 44–121)
BUN/Creatinine Ratio: 11 (ref 9–23)
BUN: 11 mg/dL (ref 6–24)
Bilirubin Total: 1.1 mg/dL (ref 0.0–1.2)
CO2: 24 mmol/L (ref 20–29)
Calcium: 9.8 mg/dL (ref 8.7–10.2)
Chloride: 105 mmol/L (ref 96–106)
Creatinine, Ser: 0.99 mg/dL (ref 0.57–1.00)
Globulin, Total: 3.3 g/dL (ref 1.5–4.5)
Glucose: 83 mg/dL (ref 70–99)
Potassium: 4.3 mmol/L (ref 3.5–5.2)
Sodium: 141 mmol/L (ref 134–144)
Total Protein: 7.7 g/dL (ref 6.0–8.5)
eGFR: 66 mL/min/{1.73_m2} (ref >59)

## 2024-02-23 LAB — LIPID PANEL
Chol/HDL Ratio: 2.9 ratio (ref 0.0–4.4)
Cholesterol, Total: 138 mg/dL (ref 100–199)
HDL: 48 mg/dL (ref >39)
LDL Chol Calc (NIH): 78 mg/dL (ref 0–99)
Triglycerides: 58 mg/dL (ref 0–149)
VLDL Cholesterol Cal: 12 mg/dL (ref 5–40)

## 2024-03-11 ENCOUNTER — Encounter: Payer: Self-pay | Admitting: Internal Medicine

## 2024-03-11 ENCOUNTER — Ambulatory Visit (INDEPENDENT_AMBULATORY_CARE_PROVIDER_SITE_OTHER): Payer: 59 | Admitting: Internal Medicine

## 2024-03-11 VITALS — BP 128/82 | HR 91 | Ht 62.0 in | Wt 182.0 lb

## 2024-03-11 DIAGNOSIS — I10 Essential (primary) hypertension: Secondary | ICD-10-CM

## 2024-03-11 DIAGNOSIS — E559 Vitamin D deficiency, unspecified: Secondary | ICD-10-CM

## 2024-03-11 DIAGNOSIS — E782 Mixed hyperlipidemia: Secondary | ICD-10-CM

## 2024-03-11 DIAGNOSIS — B078 Other viral warts: Secondary | ICD-10-CM

## 2024-03-11 DIAGNOSIS — R739 Hyperglycemia, unspecified: Secondary | ICD-10-CM | POA: Diagnosis not present

## 2024-03-11 NOTE — Progress Notes (Signed)
 Established Patient Office Visit  Subjective:  Patient ID: Gloria Huerta, female    DOB: [birth year]  Age: 58 y.o. MRN: [id number]  CC:  Chief Complaint  Patient presents with   Medical Management of Chronic Issues    6 month f/u, has concerns about wart on her right hand doesn't go away.    HPI Gloria Huerta is a 58 y.o. female with past medical history of HTN, HLD and migraine who presents for annual physical.  HTN: Her blood pressure was wnl today.  She is tolerating lisinopril  5 mg QD.  She denies any headache, dizziness, chest pain, dyspnea or palpitations.   HLD: She has been taking atorvastatin  20 mg regularly.  She has made changes in her diet. Her LDL has improved to 78 now.   She had darkening of the skin on the cheek area.  She has responded well to fluocinolone  cream.  Denies any itching currently.  Denies any recent change in soap, shampoo or lotion.  She still has painful wart on the right hand. She has worsening of pain when the area comes in pressure.  She has tried applying alcohol over the area.     Past Medical History:  Diagnosis Date   High cholesterol    Hypertension    Migraines     Past Surgical History:  Procedure Laterality Date   ABDOMINAL HYSTERECTOMY  2001    History reviewed. No pertinent family history.  Social History   Socioeconomic History   Marital status: Single    Spouse name: Not on file   Number of children: Not on file   Years of education: Not on file   Highest education level: Not on file  Occupational History   Not on file  Tobacco Use   Smoking status: Never   Smokeless tobacco: Not on file  Substance and Sexual Activity   Alcohol use: No   Drug use: No   Sexual activity: Yes    Birth control/protection: None  Other Topics Concern   Not on file  Social History Narrative   Not on file   Social Drivers of Health   Financial Resource Strain: Not on file  Food Insecurity: Not on file  Transportation  Needs: Not on file  Physical Activity: Not on file  Stress: Not on file  Social Connections: Not on file  Intimate Partner Violence: Not on file    Outpatient Medications Prior to Visit  Medication Sig Dispense Refill   atorvastatin  (LIPITOR) 20 MG tablet Take 1 tablet (20 mg total) by mouth daily. 90 tablet 1   fluocinolone  0.01 % cream APPLY TOPICALLY TWICE A DAY 30 g 1   lisinopril  (ZESTRIL ) 5 MG tablet Take 1 tablet (5 mg total) by mouth daily. 90 tablet 1   Vitamin D , Ergocalciferol , (DRISDOL ) 1.25 MG (50000 UNIT) CAPS capsule Take 1 capsule (50,000 Units total) by mouth every 7 (seven) days. 12 capsule 1   No facility-administered medications prior to visit.    No Known Allergies  ROS Review of Systems  Constitutional:  Negative for chills and fever.  HENT:  Negative for congestion, sinus pressure, sinus pain and sore throat.   Eyes:  Negative for pain and discharge.  Respiratory:  Negative for cough and shortness of breath.   Cardiovascular:  Negative for chest pain and palpitations.  Gastrointestinal:  Negative for abdominal pain, diarrhea, nausea and vomiting.  Endocrine: Negative for polydipsia and polyuria.  Genitourinary:  Negative for dysuria and hematuria.  Musculoskeletal:  Negative for neck pain and neck stiffness.  Skin:  Negative for rash.  Neurological:  Negative for dizziness and weakness.  Psychiatric/Behavioral:  Negative for agitation and behavioral problems.       Objective:    Physical Exam Vitals reviewed.  Constitutional:      General: She is not in acute distress.    Appearance: She is obese. She is not diaphoretic.  HENT:     Head: Normocephalic and atraumatic.     Right Ear: There is no impacted cerumen.     Left Ear: There is no impacted cerumen.     Nose: Nose normal.     Mouth/Throat:     Mouth: Mucous membranes are moist.  Eyes:     General: No scleral icterus.    Extraocular Movements: Extraocular movements intact.   Cardiovascular:     Rate and Rhythm: Normal rate and regular rhythm.     Heart sounds: Normal heart sounds. No murmur heard. Pulmonary:     Breath sounds: Normal breath sounds. No wheezing or rales.  Musculoskeletal:     Cervical back: Neck supple. No tenderness.     Right lower leg: No edema.     Left lower leg: No edema.  Skin:    General: Skin is warm.     Findings: Lesion (Palmar wart over right hand between index and middle finger) present. No rash.     Comments: Hyperpigmented spots over facial area -now improved  Neurological:     General: No focal deficit present.     Mental Status: She is alert and oriented to person, place, and time.  Psychiatric:        Mood and Affect: Mood normal.        Behavior: Behavior normal.     BP 128/82 (BP Location: Left Arm)   Pulse 91   Ht 5\' 2"  (1.575 m)   Wt 182 lb (82.6 kg)   SpO2 97%   BMI 33.29 kg/m  Wt Readings from Last 3 Encounters:  03/11/24 182 lb (82.6 kg)  09/12/23 181 lb 9.6 oz (82.4 kg)  05/11/23 183 lb 0.6 oz (83 kg)    Lab Results  Component Value Date   TSH 2.000 09/04/2023   Lab Results  Component Value Date   WBC 4.5 09/04/2023   HGB 14.1 09/04/2023   HCT 42.4 09/04/2023   MCV 91 09/04/2023   PLT 368 09/04/2023   Lab Results  Component Value Date   NA 141 02/22/2024   K 4.3 02/22/2024   CO2 24 02/22/2024   GLUCOSE 83 02/22/2024   BUN 11 02/22/2024   CREATININE 0.99 02/22/2024   BILITOT 1.1 02/22/2024   ALKPHOS 142 (H) 02/22/2024   AST 23 02/22/2024   ALT 17 02/22/2024   PROT 7.7 02/22/2024   ALBUMIN 4.4 02/22/2024   CALCIUM  9.8 02/22/2024   EGFR 66 02/22/2024   Lab Results  Component Value Date   CHOL 138 02/22/2024   Lab Results  Component Value Date   HDL 48 02/22/2024   Lab Results  Component Value Date   LDLCALC 78 02/22/2024   Lab Results  Component Value Date   TRIG 58 02/22/2024   Lab Results  Component Value Date   CHOLHDL 2.9 02/22/2024   Lab Results  Component  Value Date   HGBA1C 5.5 09/04/2023      Assessment & Plan:   Problem List Items Addressed This Visit       Cardiovascular and Mediastinum  Essential hypertension - Primary   BP Readings from Last 1 Encounters:  03/11/24 128/82   Well-controlled with lisinopril  5 mg QD Advised DASH diet and moderate exercise/walking, at least 150 mins/week      Relevant Orders   TSH   CMP14+EGFR   CBC with Differential/Platelet     Musculoskeletal and Integument   Palmar wart   Palmar skin lesion likely wart Advised to apply Compound W If persistent, will perform cryotherapy removal and/or refer to dermatology        Other   Mixed hyperlipidemia   Checked lipid profile Improved LDL, continue atorvastatin  20 mg QD      Relevant Orders   Lipid panel   Vitamin D  deficiency   Last vitamin D  Lab Results  Component Value Date   VD25OH 7.7 (L) 09/04/2023   On vitamin D  50,000 IU QW      Relevant Orders   VITAMIN D  25 Hydroxy (Vit-D Deficiency, Fractures)   Other Visit Diagnoses       Hyperglycemia       Relevant Orders   Hemoglobin A1c        No orders of the defined types were placed in this encounter.   Follow-up: Return in about 6 months (around 09/11/2024) for Annual physical (after 09/11/24).    Meldon Sport, MD

## 2024-03-11 NOTE — Assessment & Plan Note (Addendum)
 BP Readings from Last 1 Encounters:  03/11/24 128/82   Well-controlled with lisinopril  5 mg QD Advised DASH diet and moderate exercise/walking, at least 150 mins/week

## 2024-03-11 NOTE — Assessment & Plan Note (Signed)
 Checked lipid profile Improved LDL, continue atorvastatin  20 mg QD

## 2024-03-11 NOTE — Assessment & Plan Note (Addendum)
 Palmar skin lesion likely wart Advised to apply Compound W If persistent, will perform cryotherapy removal and/or refer to dermatology

## 2024-03-11 NOTE — Assessment & Plan Note (Signed)
 Last vitamin D  Lab Results  Component Value Date   VD25OH 7.7 (L) 09/04/2023   On vitamin D  50,000 IU QW

## 2024-03-11 NOTE — Patient Instructions (Signed)
 Please continue to take medications as prescribed.  Please continue to follow DASH diet and perform moderate exercise/walking at least 150 mins/week.  Please get fasting blood tests done before the next visit.

## 2024-05-07 ENCOUNTER — Ambulatory Visit (INDEPENDENT_AMBULATORY_CARE_PROVIDER_SITE_OTHER): Admitting: Internal Medicine

## 2024-05-07 ENCOUNTER — Encounter: Payer: Self-pay | Admitting: Internal Medicine

## 2024-05-07 VITALS — BP 115/75 | HR 103 | Ht 62.0 in | Wt 185.2 lb

## 2024-05-07 DIAGNOSIS — B078 Other viral warts: Secondary | ICD-10-CM | POA: Diagnosis not present

## 2024-05-07 NOTE — Patient Instructions (Addendum)
 Please schedule appointment with Dr Shona for wart removal.  [phone] [address] [place], [place] [address]

## 2024-05-07 NOTE — Progress Notes (Signed)
 Acute Office Visit  Subjective:    Patient ID: Gloria Huerta, female    DOB: Mar 14, 1966, 58 y.o.   MRN: [id number]  Chief Complaint  Patient presents with   Skin Problem    Pt reports wart removal is having a little pain.     HPI Patient is in today for evaluation of her hand lesion, which has been growing in size and has been more painful for the last 1 week.  She was advised to use Compound W in the last visit, which she has used and had noticed slight improvement initially.  Does not report any local bleeding or discharge.  Past Medical History:  Diagnosis Date   High cholesterol    Hypertension    Migraines     Past Surgical History:  Procedure Laterality Date   ABDOMINAL HYSTERECTOMY  2001    History reviewed. No pertinent family history.  Social History   Socioeconomic History   Marital status: Single    Spouse name: Not on file   Number of children: Not on file   Years of education: Not on file   Highest education level: Not on file  Occupational History   Not on file  Tobacco Use   Smoking status: Never   Smokeless tobacco: Not on file  Substance and Sexual Activity   Alcohol use: No   Drug use: No   Sexual activity: Yes    Birth control/protection: None  Other Topics Concern   Not on file  Social History Narrative   Not on file   Social Drivers of Health   Financial Resource Strain: Not on file  Food Insecurity: Not on file  Transportation Needs: Not on file  Physical Activity: Not on file  Stress: Not on file  Social Connections: Not on file  Intimate Partner Violence: Not on file    Outpatient Medications Prior to Visit  Medication Sig Dispense Refill   atorvastatin  (LIPITOR) 20 MG tablet Take 1 tablet (20 mg total) by mouth daily. 90 tablet 1   fluocinolone  0.01 % cream APPLY TOPICALLY TWICE A DAY 30 g 1   lisinopril  (ZESTRIL ) 5 MG tablet Take 1 tablet (5 mg total) by mouth daily. 90 tablet 1   Vitamin D , Ergocalciferol , (DRISDOL )  1.25 MG (50000 UNIT) CAPS capsule Take 1 capsule (50,000 Units total) by mouth every 7 (seven) days. 12 capsule 1   No facility-administered medications prior to visit.    No Known Allergies  Review of Systems  Constitutional:  Negative for chills and fever.  HENT:  Negative for congestion, sinus pressure, sinus pain and sore throat.   Eyes:  Negative for pain and discharge.  Respiratory:  Negative for cough and shortness of breath.   Cardiovascular:  Negative for chest pain and palpitations.  Gastrointestinal:  Negative for abdominal pain, diarrhea, nausea and vomiting.  Endocrine: Negative for polydipsia and polyuria.  Genitourinary:  Negative for dysuria and hematuria.  Musculoskeletal:  Negative for neck pain and neck stiffness.  Skin:  Negative for rash.  Neurological:  Negative for dizziness and weakness.  Psychiatric/Behavioral:  Negative for agitation and behavioral problems.        Objective:    Physical Exam Vitals reviewed.  Constitutional:      General: She is not in acute distress.    Appearance: She is obese. She is not diaphoretic.  Eyes:     General: No scleral icterus. Skin:    General: Skin is warm.     Findings:  Lesion (Palmar wart over right hand between index and middle finger with erythematous base) present. No rash.     Comments: Hyperpigmented spots over facial area -now improved  Neurological:     General: No focal deficit present.     Mental Status: She is alert and oriented to person, place, and time.  Psychiatric:        Mood and Affect: Mood normal.        Behavior: Behavior normal.     BP 115/75   Pulse (!) 103   Ht 5' 2 (1.575 m)   Wt 185 lb 3.2 oz (84 kg)   SpO2 98%   BMI 33.87 kg/m  Wt Readings from Last 3 Encounters:  05/07/24 185 lb 3.2 oz (84 kg)  03/11/24 182 lb (82.6 kg)  09/12/23 181 lb 9.6 oz (82.4 kg)        Assessment & Plan:   Problem List Items Addressed This Visit       Musculoskeletal and Integument    Palmar wart - Primary   Palmar skin lesion likely wart Has tried Compound W Due to persistent lesion, will refer to dermatology - avoid liquid nitrogen due to local inflammation      Relevant Orders   Ambulatory referral to Dermatology     No orders of the defined types were placed in this encounter.    Suzzane MARLA Blanch, MD

## 2024-05-07 NOTE — Assessment & Plan Note (Signed)
 Palmar skin lesion likely wart Has tried Compound W Due to persistent lesion, will refer to dermatology - avoid liquid nitrogen due to local inflammation

## 2024-05-12 ENCOUNTER — Telehealth: Payer: Self-pay | Admitting: Internal Medicine

## 2024-05-12 NOTE — Telephone Encounter (Signed)
 Patient came in office in regard to Dermatology referral  [facility] office does not accept patients insurance.  Patient is requesting new referral that is in network with [employer]   Patient wants a call back with new dermatology referral info

## 2024-05-12 NOTE — Telephone Encounter (Signed)
 Referral sent to: [facility] [address] [place], [address] [place] [address] [phone]  MyChart Message sent to Patient with Specialty Office contact information.

## 2024-05-29 ENCOUNTER — Telehealth: Payer: Self-pay

## 2024-05-29 ENCOUNTER — Other Ambulatory Visit: Payer: Self-pay | Admitting: Internal Medicine

## 2024-05-29 DIAGNOSIS — L819 Disorder of pigmentation, unspecified: Secondary | ICD-10-CM

## 2024-05-29 MED ORDER — FLUOCINOLONE ACETONIDE 0.01 % EX CREA: TOPICAL_CREAM | Freq: Two times a day (BID) | CUTANEOUS | 1 refills | Status: DC

## 2024-05-29 NOTE — Telephone Encounter (Signed)
 Called pt to inform her that her fluocinolone  0.01 % cream has been refilled and sent to her pharmacy

## 2024-05-29 NOTE — Addendum Note (Signed)
 Addended byBETHA TOBIE DOWNS on: 05/29/2024 09:17 AM   Modules accepted: Orders

## 2024-05-29 NOTE — Telephone Encounter (Signed)
 Left voicemail to call back  Reason for call documented in chart

## 2024-05-29 NOTE — Telephone Encounter (Signed)
 Prescription Request  05/29/2024  LOV: Visit date not found  What is the name of the medication or equipment? fluocinolone  0.01 % cream   Have you contacted your pharmacy to request a refill? Yes   Which pharmacy would you like this sent to?     [facility]   Patient notified that their request is being sent to the clinical staff for review and that they should receive a response within 2 business days.   Please advise at Mobile [phone] (mobile)

## 2024-05-30 ENCOUNTER — Telehealth: Payer: Self-pay | Admitting: Internal Medicine

## 2024-05-30 NOTE — Telephone Encounter (Signed)
 Patient called and she says that Corrie is saying that Dr. Tobie needs to fill out a paper in order for her to get the Rx. They will not give a price to her saying the insurance has not released it yet. I asked is it a prior authorization. She says she doesn't know what it is. Advised I will call [facility].  [facility] called and spoke to Marcey, Pensions consultant about the refill(s) fluocinolone  requested. Advised it was sent on 05/29/24. He says the insurance is requiring a prior authorization. Advised I will send this to the office.    Copied from CRM [id number]. Topic: Clinical - Prescription Issue >> May 30, 2024  2:18 PM Everette C wrote: Reason for CRM: The patient would like to please be contacted by a member of clinical staff when possible to discuss needed refills of their fluocinolone  0.01 % cream [[id number]] prescription

## 2024-06-02 ENCOUNTER — Telehealth: Payer: Self-pay | Admitting: Pharmacy Technician

## 2024-06-02 ENCOUNTER — Other Ambulatory Visit (HOSPITAL_COMMUNITY): Payer: Self-pay

## 2024-06-02 NOTE — Telephone Encounter (Signed)
 PA request has been Started. New Encounter has been or will be created for follow up. For additional info see Pharmacy Prior Auth telephone encounter from 06/02/2024.

## 2024-06-02 NOTE — Telephone Encounter (Signed)
 Pharmacy Patient Advocate Encounter   Received notification from Pt Calls Messages that prior authorization for Fluocinolone  0.01% cream is required/requested.   Insurance verification completed.   The patient is insured through [employer] .   Per test claim: PA required; PA submitted to above mentioned insurance via LATENT Key/confirmation #/EOC [id number] Status is pending

## 2024-06-04 ENCOUNTER — Other Ambulatory Visit (HOSPITAL_COMMUNITY): Payer: Self-pay

## 2024-06-04 NOTE — Telephone Encounter (Signed)
 Pharmacy Patient Advocate Encounter  Received notification from [employer] that Prior Authorization for Fluocinolone  Acetonide 0.01% cream  has been DENIED.  Full denial letter will be uploaded to the media tab. See denial reason below.   PA #/Case ID/Reference #: [id number]

## 2024-06-08 ENCOUNTER — Other Ambulatory Visit: Payer: Self-pay | Admitting: Internal Medicine

## 2024-06-08 DIAGNOSIS — L239 Allergic contact dermatitis, unspecified cause: Secondary | ICD-10-CM

## 2024-06-08 DIAGNOSIS — L819 Disorder of pigmentation, unspecified: Secondary | ICD-10-CM

## 2024-06-08 MED ORDER — FLUOCINOLONE ACETONIDE 0.01 % EX CREA: TOPICAL_CREAM | Freq: Two times a day (BID) | CUTANEOUS | 1 refills | Status: DC

## 2024-06-09 ENCOUNTER — Encounter: Payer: Self-pay | Admitting: Pharmacy Technician

## 2024-06-09 ENCOUNTER — Other Ambulatory Visit (HOSPITAL_COMMUNITY): Payer: Self-pay

## 2024-06-09 ENCOUNTER — Telehealth: Payer: Self-pay | Admitting: Pharmacy Technician

## 2024-06-09 NOTE — Telephone Encounter (Signed)
 Pharmacy Patient Advocate Encounter   Received notification from Patient Pharmacy that prior authorization for Fluocinolone  Acetonide 0.01% cream  is required/requested.   Insurance verification completed.   The patient is insured through [employer] .   Per test claim: PA required; PA submitted to above mentioned insurance via LATENT Key/confirmation #/EOC [id number] Status is pending  New case opened with allergic dermatitis as the diagnosis per providers request.

## 2024-06-09 NOTE — Telephone Encounter (Signed)
 Pharmacy Patient Advocate Encounter  Received notification from [facility] [employer] that Prior Authorization for Fluocinolone  Acetonide 0.01% cream  has been DENIED.  Full denial letter will be uploaded to the media tab. See denial reason below.   PA #/Case ID/Reference #: [id number]

## 2024-06-09 NOTE — Telephone Encounter (Signed)
 ERROR

## 2024-06-10 ENCOUNTER — Other Ambulatory Visit: Payer: Self-pay | Admitting: Internal Medicine

## 2024-06-10 DIAGNOSIS — L239 Allergic contact dermatitis, unspecified cause: Secondary | ICD-10-CM

## 2024-06-10 MED ORDER — FLUOCINOLONE ACETONIDE 0.01 % EX SOLN: Freq: Two times a day (BID) | CUTANEOUS | 1 refills | Status: DC

## 2024-06-10 NOTE — Telephone Encounter (Signed)
 Patient advised.

## 2024-08-01 ENCOUNTER — Telehealth: Payer: Self-pay | Admitting: Internal Medicine

## 2024-08-01 ENCOUNTER — Other Ambulatory Visit: Payer: Self-pay | Admitting: Internal Medicine

## 2024-08-01 DIAGNOSIS — L239 Allergic contact dermatitis, unspecified cause: Secondary | ICD-10-CM

## 2024-08-01 MED ORDER — FLUOCINOLONE ACETONIDE 0.01 % EX SOLN
Freq: Two times a day (BID) | CUTANEOUS | 5 refills | Status: DC
Start: 2024-08-01 — End: 2024-08-04

## 2024-08-01 NOTE — Telephone Encounter (Signed)
 Prescription Request  08/01/2024  LOV: 05/07/2024  What is the name of the medication or equipment? fluocinolone  (SYNALAR ) 0.01 % external solution [[id number]]   Have you contacted your pharmacy to request a refill? Yes   Which pharmacy would you like this sent to?   [facility] [address]  Patient notified that their request is being sent to the clinical staff for review and that they should receive a response within 2 business days.   Please advise at walked into office

## 2024-08-01 NOTE — Telephone Encounter (Signed)
 Daughter advised.

## 2024-08-03 ENCOUNTER — Other Ambulatory Visit: Payer: Self-pay | Admitting: Internal Medicine

## 2024-08-03 DIAGNOSIS — L239 Allergic contact dermatitis, unspecified cause: Secondary | ICD-10-CM

## 2024-08-04 ENCOUNTER — Telehealth: Payer: Self-pay | Admitting: Pharmacy Technician

## 2024-08-04 ENCOUNTER — Other Ambulatory Visit: Payer: Self-pay | Admitting: Internal Medicine

## 2024-08-04 ENCOUNTER — Other Ambulatory Visit (HOSPITAL_COMMUNITY): Payer: Self-pay

## 2024-08-04 DIAGNOSIS — L819 Disorder of pigmentation, unspecified: Secondary | ICD-10-CM

## 2024-08-04 MED ORDER — DESONIDE 0.05 % EX CREA: TOPICAL_CREAM | Freq: Two times a day (BID) | CUTANEOUS | 1 refills | Status: DC

## 2024-08-04 NOTE — Telephone Encounter (Signed)
 Pharmacy Patient Advocate Encounter   Received notification from CoverMyMeds that prior authorization for Fluocinolone  Acetonide 0.01% solution is required/requested.   Insurance verification completed.   The patient is insured through [facility] MEDICAID .   Per test claim:  SEE BELOW is preferred by the insurance.  If suggested medication is appropriate, Please send in a new RX and discontinue this one. If not, please advise as to why it's not appropriate so that we may request a Prior Authorization. Please note, some preferred medications may still require a PA.  If the suggested medications have not been trialed and there are no contraindications to their use, the PA will not be submitted, as it will not be approved.    NEW INSURANCE FOR PATIENT EFFECTIVE 06/06/2024.

## 2024-08-05 NOTE — Telephone Encounter (Signed)
Pt informed

## 2024-09-02 DIAGNOSIS — R739 Hyperglycemia, unspecified: Secondary | ICD-10-CM | POA: Diagnosis not present

## 2024-09-02 DIAGNOSIS — E559 Vitamin D deficiency, unspecified: Secondary | ICD-10-CM | POA: Diagnosis not present

## 2024-09-02 DIAGNOSIS — E782 Mixed hyperlipidemia: Secondary | ICD-10-CM | POA: Diagnosis not present

## 2024-09-03 LAB — LIPID PANEL
Chol/HDL Ratio: 2.7 ratio (ref 0.0–4.4)
Cholesterol, Total: 146 mg/dL (ref 100–199)
HDL: 54 mg/dL (ref >39)
LDL Chol Calc (NIH): 79 mg/dL (ref 0–99)
Triglycerides: 62 mg/dL (ref 0–149)
VLDL Cholesterol Cal: 13 mg/dL (ref 5–40)

## 2024-09-03 LAB — CMP14+EGFR
ALT: 20 IU/L (ref 0–32)
AST: 24 IU/L (ref 0–40)
Albumin: 4.5 g/dL (ref 3.8–4.9)
Alkaline Phosphatase: 156 IU/L — ABNORMAL HIGH (ref 49–135)
BUN/Creatinine Ratio: 10 (ref 9–23)
BUN: 11 mg/dL (ref 6–24)
Bilirubin Total: 0.8 mg/dL (ref 0.0–1.2)
CO2: 23 mmol/L (ref 20–29)
Calcium: 10 mg/dL (ref 8.7–10.2)
Chloride: 101 mmol/L (ref 96–106)
Creatinine, Ser: 1.06 mg/dL — ABNORMAL HIGH (ref 0.57–1.00)
Globulin, Total: 3.2 g/dL (ref 1.5–4.5)
Glucose: 88 mg/dL (ref 70–99)
Potassium: 4.1 mmol/L (ref 3.5–5.2)
Sodium: 139 mmol/L (ref 134–144)
Total Protein: 7.7 g/dL (ref 6.0–8.5)
eGFR: 61 mL/min/1.73 (ref >59)

## 2024-09-03 LAB — CBC WITH DIFFERENTIAL/PLATELET
Basophils Absolute: 0 x10E3/uL (ref 0.0–0.2)
Basos: 1 %
EOS (ABSOLUTE): 0.1 x10E3/uL (ref 0.0–0.4)
Eos: 3 %
Hematocrit: 40.7 % (ref 34.0–46.6)
Hemoglobin: 13.7 g/dL (ref 11.1–15.9)
Immature Grans (Abs): 0 x10E3/uL (ref 0.0–0.1)
Immature Granulocytes: 0 %
Lymphocytes Absolute: 1.7 x10E3/uL (ref 0.7–3.1)
Lymphs: 40 %
MCH: 30.4 pg (ref 26.6–33.0)
MCHC: 33.7 g/dL (ref 31.5–35.7)
MCV: 90 fL (ref 79–97)
Monocytes Absolute: 0.5 x10E3/uL (ref 0.1–0.9)
Monocytes: 11 %
Neutrophils Absolute: 1.9 x10E3/uL (ref 1.4–7.0)
Neutrophils: 45 %
Platelets: 345 x10E3/uL (ref 150–450)
RBC: 4.5 x10E6/uL (ref 3.77–5.28)
RDW: 12.9 % (ref 11.7–15.4)
WBC: 4.2 x10E3/uL (ref 3.4–10.8)

## 2024-09-03 LAB — VITAMIN D 25 HYDROXY (VIT D DEFICIENCY, FRACTURES): Vit D, 25-Hydroxy: 78.8 ng/mL (ref 30.0–100.0)

## 2024-09-03 LAB — HEMOGLOBIN A1C
Est. average glucose Bld gHb Est-mCnc: 111 mg/dL
Hgb A1c MFr Bld: 5.5 % (ref 4.8–5.6)

## 2024-09-03 LAB — TSH: TSH: 1.82 u[IU]/mL (ref 0.450–4.500)

## 2024-09-11 ENCOUNTER — Ambulatory Visit: Admitting: Internal Medicine

## 2024-09-11 ENCOUNTER — Encounter: Payer: Self-pay | Admitting: Internal Medicine

## 2024-09-11 VITALS — BP 111/76 | HR 96 | Ht 62.0 in | Wt 186.2 lb

## 2024-09-11 DIAGNOSIS — I10 Essential (primary) hypertension: Secondary | ICD-10-CM

## 2024-09-11 DIAGNOSIS — L819 Disorder of pigmentation, unspecified: Secondary | ICD-10-CM | POA: Diagnosis not present

## 2024-09-11 DIAGNOSIS — Z23 Encounter for immunization: Secondary | ICD-10-CM

## 2024-09-11 DIAGNOSIS — Z1211 Encounter for screening for malignant neoplasm of colon: Secondary | ICD-10-CM | POA: Diagnosis not present

## 2024-09-11 DIAGNOSIS — Z0001 Encounter for general adult medical examination with abnormal findings: Secondary | ICD-10-CM | POA: Diagnosis not present

## 2024-09-11 DIAGNOSIS — E782 Mixed hyperlipidemia: Secondary | ICD-10-CM | POA: Diagnosis not present

## 2024-09-11 DIAGNOSIS — B078 Other viral warts: Secondary | ICD-10-CM | POA: Diagnosis not present

## 2024-09-11 MED ORDER — DESONIDE 0.05 % EX CREA: TOPICAL_CREAM | Freq: Two times a day (BID) | CUTANEOUS | 1 refills | Status: AC

## 2024-09-11 NOTE — Progress Notes (Signed)
 Established Patient Office Visit  Subjective:  Patient ID: Gloria Huerta, female    DOB: 1966/11/05  Age: 58 y.o. MRN: [id number]  CC:  Chief Complaint  Patient presents with   Annual Exam    Cpe     HPI Gloria ROZZELL is a 58 y.o. female with past medical history of HTN, HLD and migraine who presents for annual physical.  HTN: Her blood pressure was wnl today.  She is tolerating lisinopril  5 mg QD.  She denies any headache, dizziness, chest pain, dyspnea or palpitations.   HLD: She has been taking atorvastatin  20 mg regularly.  She has made changes in her diet. Her LDL has improved to 79 now.  She had darkening of the skin on the cheek area.  She has responded well to fluocinolone  cream, but was not covered by insurance recently.  Denies any itching currently.  Denies any recent change in soap, shampoo or lotion.  She still has painful wart on the right hand. She has worsening of pain when the area comes in pressure.  She has tried applying alcohol over the area.     Past Medical History:  Diagnosis Date   High cholesterol    Hypertension    Migraines     Past Surgical History:  Procedure Laterality Date   ABDOMINAL HYSTERECTOMY  2001    History reviewed. No pertinent family history.  Social History   Socioeconomic History   Marital status: Single    Spouse name: Not on file   Number of children: Not on file   Years of education: Not on file   Highest education level: Not on file  Occupational History   Not on file  Tobacco Use   Smoking status: Never   Smokeless tobacco: Not on file  Substance and Sexual Activity   Alcohol use: No   Drug use: No   Sexual activity: Yes    Birth control/protection: None  Other Topics Concern   Not on file  Social History Narrative   Not on file   Social Drivers of Health   Financial Resource Strain: Not on file  Food Insecurity: Not on file  Transportation Needs: Not on file  Physical Activity: Not on  file  Stress: Not on file  Social Connections: Not on file  Intimate Partner Violence: Not on file    Outpatient Medications Prior to Visit  Medication Sig Dispense Refill   atorvastatin  (LIPITOR) 20 MG tablet Take 1 tablet (20 mg total) by mouth daily. 90 tablet 1   fluocinolone  (SYNALAR ) 0.01 % external solution APPLY  SOLUTION EXTERNALLY TWICE DAILY 60 mL 0   lisinopril  (ZESTRIL ) 5 MG tablet Take 1 tablet (5 mg total) by mouth daily. 90 tablet 1   Vitamin D , Ergocalciferol , (DRISDOL ) 1.25 MG (50000 UNIT) CAPS capsule Take 1 capsule (50,000 Units total) by mouth every 7 (seven) days. 12 capsule 1   desonide (DESOWEN) 0.05 % cream Apply topically 2 (two) times daily. 60 g 1   No facility-administered medications prior to visit.    No Known Allergies  ROS Review of Systems  Constitutional:  Negative for chills and fever.  HENT:  Negative for congestion, sinus pressure, sinus pain and sore throat.   Eyes:  Negative for pain and discharge.  Respiratory:  Negative for cough and shortness of breath.   Cardiovascular:  Negative for chest pain and palpitations.  Gastrointestinal:  Negative for abdominal pain, diarrhea, nausea and vomiting.  Endocrine: Negative for polydipsia and  polyuria.  Genitourinary:  Negative for dysuria and hematuria.  Musculoskeletal:  Negative for neck pain and neck stiffness.  Skin:  Negative for rash.  Neurological:  Negative for dizziness and weakness.  Psychiatric/Behavioral:  Negative for agitation and behavioral problems.       Objective:    Physical Exam Vitals reviewed.  Constitutional:      General: She is not in acute distress.    Appearance: She is obese. She is not diaphoretic.  HENT:     Head: Normocephalic and atraumatic.     Right Ear: There is no impacted cerumen.     Left Ear: There is no impacted cerumen.     Nose: Nose normal.     Mouth/Throat:     Mouth: Mucous membranes are moist.  Eyes:     General: No scleral icterus.     Extraocular Movements: Extraocular movements intact.  Cardiovascular:     Rate and Rhythm: Normal rate and regular rhythm.     Heart sounds: Normal heart sounds. No murmur heard. Pulmonary:     Breath sounds: Normal breath sounds. No wheezing or rales.  Abdominal:     Palpations: Abdomen is soft.     Tenderness: There is no abdominal tenderness.  Musculoskeletal:     Cervical back: Neck supple. No tenderness.     Right lower leg: No edema.     Left lower leg: No edema.  Skin:    General: Skin is warm.     Findings: Lesion (Palmar wart over right hand between index and middle finger) present. No rash.     Comments: Hyperpigmented spots over facial area -now improved  Neurological:     General: No focal deficit present.     Mental Status: She is alert and oriented to person, place, and time.  Psychiatric:        Mood and Affect: Mood normal.        Behavior: Behavior normal.     BP 111/76   Pulse 96   Ht 5' 2 (1.575 m)   Wt 186 lb 3.2 oz (84.5 kg)   SpO2 98%   BMI 34.06 kg/m  Wt Readings from Last 3 Encounters:  09/11/24 186 lb 3.2 oz (84.5 kg)  05/07/24 185 lb 3.2 oz (84 kg)  03/11/24 182 lb (82.6 kg)    Lab Results  Component Value Date   TSH 1.820 09/02/2024   Lab Results  Component Value Date   WBC 4.2 09/02/2024   HGB 13.7 09/02/2024   HCT 40.7 09/02/2024   MCV 90 09/02/2024   PLT 345 09/02/2024   Lab Results  Component Value Date   NA 139 09/02/2024   K 4.1 09/02/2024   CO2 23 09/02/2024   GLUCOSE 88 09/02/2024   BUN 11 09/02/2024   CREATININE 1.06 (H) 09/02/2024   BILITOT 0.8 09/02/2024   ALKPHOS 156 (H) 09/02/2024   AST 24 09/02/2024   ALT 20 09/02/2024   PROT 7.7 09/02/2024   ALBUMIN 4.5 09/02/2024   CALCIUM  10.0 09/02/2024   EGFR 61 09/02/2024   Lab Results  Component Value Date   CHOL 146 09/02/2024   Lab Results  Component Value Date   HDL 54 09/02/2024   Lab Results  Component Value Date   LDLCALC 79 09/02/2024   Lab Results   Component Value Date   TRIG 62 09/02/2024   Lab Results  Component Value Date   CHOLHDL 2.7 09/02/2024   Lab Results  Component Value Date  HGBA1C 5.5 09/02/2024      Assessment & Plan:   Problem List Items Addressed This Visit       Cardiovascular and Mediastinum   Essential hypertension   BP Readings from Last 1 Encounters:  09/11/24 111/76   Well-controlled with lisinopril  5 mg QD Advised DASH diet and moderate exercise/walking, at least 150 mins/week      Relevant Orders   CMP14+EGFR     Musculoskeletal and Integument   Palmar wart   Palmar skin lesion likely wart Has tried Compound W Due to persistent lesion, referred to dermatology      Hyperpigmentation of skin   Similar to melasma Had responded well to fluocinolone  cream, but not covered by insurance now Switched to Desonide cream      Relevant Medications   desonide (DESOWEN) 0.05 % cream     Other   Encounter for general adult medical examination with abnormal findings - Primary   Physical exam as documented. Counseling done  re healthy lifestyle involving commitment to 150 minutes exercise per week, heart healthy diet, and attaining healthy weight.The importance of adequate sleep also discussed. Immunization and cancer screening needs are specifically addressed at this visit.      Mixed hyperlipidemia   Checked lipid profile Improved LDL, continue atorvastatin  20 mg QD      Colon cancer screening   Discussed about colonoscopy and cologuard - benefits of each procedure discussed. Patient prefers Cologuard - ordered.      Relevant Orders   Cologuard   Other Visit Diagnoses       Encounter for immunization       Relevant Orders   Flu vaccine trivalent PF, 6mos and older(Flulaval,Afluria,Fluarix,Fluzone) (Completed)        Meds ordered this encounter  Medications   desonide (DESOWEN) 0.05 % cream    Sig: Apply topically 2 (two) times daily.    Dispense:  60 g    Refill:  1     Follow-up: Return in about 6 months (around 03/11/2025) for HTN.    Suzzane MARLA Blanch, MD

## 2024-09-11 NOTE — Assessment & Plan Note (Signed)
 Discussed about colonoscopy and cologuard - benefits of each procedure discussed. Patient prefers Cologuard - ordered.

## 2024-09-11 NOTE — Patient Instructions (Addendum)
Please continue to take medications as prescribed.  Please continue to follow low carb diet and perform moderate exercise/walking at least 150 mins/week.  Please get blood tests done before the next visit.

## 2024-09-11 NOTE — Assessment & Plan Note (Signed)
 Physical exam as documented. Counseling done  re healthy lifestyle involving commitment to 150 minutes exercise per week, heart healthy diet, and attaining healthy weight.The importance of adequate sleep also discussed. Immunization and cancer screening needs are specifically addressed at this visit.

## 2024-09-11 NOTE — Assessment & Plan Note (Signed)
 Similar to melasma Had responded well to fluocinolone  cream, but not covered by insurance now Switched to Desonide cream

## 2024-09-11 NOTE — Assessment & Plan Note (Signed)
 BP Readings from Last 1 Encounters:  09/11/24 111/76   Well-controlled with lisinopril  5 mg QD Advised DASH diet and moderate exercise/walking, at least 150 mins/week

## 2024-09-11 NOTE — Assessment & Plan Note (Addendum)
 Palmar skin lesion likely wart Has tried Compound W Due to persistent lesion, referred to dermatology

## 2024-09-11 NOTE — Assessment & Plan Note (Signed)
 Checked lipid profile Improved LDL, continue atorvastatin  20 mg QD

## 2024-10-13 ENCOUNTER — Other Ambulatory Visit: Payer: Self-pay | Admitting: Internal Medicine

## 2024-10-13 DIAGNOSIS — I10 Essential (primary) hypertension: Secondary | ICD-10-CM

## 2024-12-08 ENCOUNTER — Other Ambulatory Visit: Payer: Self-pay | Admitting: Internal Medicine

## 2024-12-08 DIAGNOSIS — I10 Essential (primary) hypertension: Secondary | ICD-10-CM

## 2024-12-08 DIAGNOSIS — E559 Vitamin D deficiency, unspecified: Secondary | ICD-10-CM

## 2024-12-09 ENCOUNTER — Other Ambulatory Visit: Payer: Self-pay

## 2024-12-09 ENCOUNTER — Telehealth: Payer: Self-pay

## 2024-12-09 DIAGNOSIS — E782 Mixed hyperlipidemia: Secondary | ICD-10-CM

## 2024-12-09 MED ORDER — ATORVASTATIN CALCIUM 20 MG PO TABS: 20.0000 mg | ORAL_TABLET | Freq: Every day | ORAL | 1 refills | Status: AC

## 2024-12-09 NOTE — Telephone Encounter (Signed)
 Copied from CRM [id number]. Topic: Clinical - Medication Refill >> Dec 08, 2024  1:04 PM Winona R wrote: Medication: atorvastatin  (LIPITOR) 20 MG tablet [[id number]]  Has the patient contacted their pharmacy? Yes- informed the pt she can't have a refill until March.  (Agent: If no, request that the patient contact the pharmacy for the refill. If patient does not wish to contact the pharmacy document the reason why and proceed with request.) (Agent: If yes, when and what did the pharmacy advise?)  This is the patient's preferred pharmacy:  [facility] [address], [place] - [address] [place] [address] Phone: [phone] Fax: [phone]  Is this the correct pharmacy for this prescription? Yes If no, delete pharmacy and type the correct one.   Has the prescription been filled recently? Yes  Is the patient out of the medication? Yes  Has the patient been seen for an appointment in the last year OR does the patient have an upcoming appointment? Yes  Can we respond through MyChart? Yes  Agent: Please be advised that Rx refills may take up to 3 business days. We ask that you follow-up with your pharmacy.

## 2024-12-09 NOTE — Telephone Encounter (Signed)
 Refill sent to pharmacy.

## 2024-12-22 ENCOUNTER — Ambulatory Visit: Admitting: Dermatology

## 2024-12-24 ENCOUNTER — Ambulatory Visit: Admitting: Dermatology

## 2025-03-11 ENCOUNTER — Encounter: Admitting: Internal Medicine
